# Patient Record
Sex: Male | Born: 1950 | Race: White | Hispanic: No | Marital: Married | Smoking: Never smoker
Health system: Southern US, Community
[De-identification: ages and names within clinical notes are randomized; demographics above are authoritative.]

## PROBLEM LIST (undated history)

## (undated) DIAGNOSIS — S060X9A Concussion with loss of consciousness of unspecified duration, initial encounter: Secondary | ICD-10-CM

## (undated) DIAGNOSIS — I4891 Unspecified atrial fibrillation: Secondary | ICD-10-CM

## (undated) DIAGNOSIS — M109 Gout, unspecified: Secondary | ICD-10-CM

## (undated) DIAGNOSIS — N433 Hydrocele, unspecified: Secondary | ICD-10-CM

## (undated) DIAGNOSIS — I10 Essential (primary) hypertension: Secondary | ICD-10-CM

## (undated) DIAGNOSIS — S060XAA Concussion with loss of consciousness status unknown, initial encounter: Secondary | ICD-10-CM

## (undated) DIAGNOSIS — C801 Malignant (primary) neoplasm, unspecified: Secondary | ICD-10-CM

## (undated) DIAGNOSIS — J189 Pneumonia, unspecified organism: Secondary | ICD-10-CM

## (undated) HISTORY — PX: COLONOSCOPY: SHX174

## (undated) HISTORY — DX: Essential (primary) hypertension: I10

## (undated) HISTORY — PX: TONSILLECTOMY: SUR1361

## (undated) HISTORY — DX: Gout, unspecified: M10.9

---

## 1983-08-02 HISTORY — PX: INGUINAL HERNIA REPAIR: SUR1180

## 2012-03-29 ENCOUNTER — Other Ambulatory Visit: Payer: Self-pay | Admitting: Internal Medicine

## 2012-03-29 DIAGNOSIS — R319 Hematuria, unspecified: Secondary | ICD-10-CM

## 2012-04-03 ENCOUNTER — Other Ambulatory Visit: Payer: Self-pay

## 2012-04-06 ENCOUNTER — Ambulatory Visit
Admission: RE | Admit: 2012-04-06 | Discharge: 2012-04-06 | Disposition: A | Discharge status: Home / Self Care | Payer: PRIVATE HEALTH INSURANCE | Source: Ambulatory Visit | Attending: Internal Medicine | Admitting: Internal Medicine

## 2012-04-06 DIAGNOSIS — R319 Hematuria, unspecified: Secondary | ICD-10-CM

## 2012-05-30 ENCOUNTER — Encounter (INDEPENDENT_AMBULATORY_CARE_PROVIDER_SITE_OTHER): Payer: Self-pay | Admitting: General Surgery

## 2012-05-31 ENCOUNTER — Encounter (INDEPENDENT_AMBULATORY_CARE_PROVIDER_SITE_OTHER): Payer: Self-pay | Admitting: General Surgery

## 2012-05-31 ENCOUNTER — Ambulatory Visit (INDEPENDENT_AMBULATORY_CARE_PROVIDER_SITE_OTHER): Payer: PRIVATE HEALTH INSURANCE | Admitting: General Surgery

## 2012-05-31 VITALS — BP 120/80 | HR 76 | Temp 98.5°F | Resp 16 | Ht 71.0 in | Wt 167.0 lb

## 2012-05-31 DIAGNOSIS — K3189 Other diseases of stomach and duodenum: Secondary | ICD-10-CM

## 2012-05-31 DIAGNOSIS — K319 Disease of stomach and duodenum, unspecified: Secondary | ICD-10-CM

## 2012-05-31 NOTE — Patient Instructions (Signed)
We will refer you to your gastroenterologist for upper endoscopy (EGD) +/- EUS (endoscopic ultrasound) to evaluate mass

## 2012-05-31 NOTE — Progress Notes (Signed)
Patient ID: [name], male   DOB: [birth year], 61 y.o.   MRN: [id number]  Chief Complaint  Patient presents with  . Mass    abdomen    HPI [name] is a 61 y.o. male.   HPI 61 year old Caucasian male referred by Dr. Annabell Howells for evaluation of a lobular mass seen on the CT scan of his abdomen and pelvis. The patient was getting a CT scan to evaluate for blood in his urine. He states that after his physical by his primary care physician a few months ago he developed some right inguinal discomfort. It was intermittent in nature. It was more of a pressure sensation. Sitting upright for prolonged period of time would aggravate it. He was put on a trial of antibiotics with some improvement. However once antibiotics ran out he started to have recurrent symptoms. He was ultimately referred to urology. They diagnosed a right spermatocele as well as microscopic blood in his urine. On his CT scan of his abdomen and pelvis there was found to be a lobular mass seen next is third portion of the duodenum and inferior vena cava. He is referred here for evaluation for that.  He denies any abdominal pain otherwise. He denies any bloating, early satiety, reflux, weight loss, jaundice, diarrhea, constipation, melena, or hematochezia. He states he had a colonoscopy a few years ago which was normal. He states that his grandfather died from some form of cancer although he cannot recall what type. He reports a normal energy level. He also reports a normal appetite. He denies any palpitations Past Medical History  Diagnosis Date  . Hypertension   . Gout     Past Surgical History  Procedure Date  . Inguinal hernia repair 1985    right    Family History  Problem Relation Age of Onset  . Heart disease Father     Social History History  Substance Use Topics  . Smoking status: Never Smoker   . Smokeless tobacco: Not on file  . Alcohol Use: Yes     2-3 per day    Allergies  Allergen Reactions  .  Sulfa Antibiotics Itching    Current Outpatient Prescriptions  Medication Sig Dispense Refill  . AMLODIPINE BESYLATE PO Take by mouth daily.      . naproxen sodium (ANAPROX) 220 MG tablet Take 220 mg by mouth as needed.        Review of Systems Review of Systems  Constitutional: Negative for fever, chills, appetite change and unexpected weight change.  HENT: Negative for congestion and trouble swallowing.   Eyes: Negative for photophobia, redness and visual disturbance.  Respiratory: Negative for chest tightness and shortness of breath.   Cardiovascular: Negative for chest pain and leg swelling.       No PND, no orthopnea, no DOE  Gastrointestinal:       See HPI  Genitourinary: Positive for testicular pain. Negative for dysuria, hematuria and difficulty urinating.  Musculoskeletal: Negative.   Skin: Negative for rash.  Neurological: Negative for dizziness, seizures, facial asymmetry and speech difficulty.  Hematological: Negative for adenopathy. Does not bruise/bleed easily.  Psychiatric/Behavioral: Negative for behavioral problems and confusion.    Blood pressure 120/80, pulse 76, temperature 98.5 F (36.9 C), temperature source Temporal, resp. rate 16, height 5\' 11"  (1.803 m), weight 167 lb (75.751 kg).  Physical Exam Physical Exam  Vitals reviewed. Constitutional: He is oriented to person, place, and time. He appears well-developed and well-nourished. No distress.  HENT:  Head: Normocephalic and atraumatic.  Right Ear: External ear normal.  Left Ear: External ear normal.  Eyes: Conjunctivae normal are normal. No scleral icterus.  Neck: Normal range of motion. Neck supple. No tracheal deviation present. No thyromegaly present.  Cardiovascular: Normal rate, regular rhythm and normal heart sounds.   Pulmonary/Chest: Effort normal and breath sounds normal. No respiratory distress. He has no wheezes.  Abdominal: Soft. Bowel sounds are normal. He exhibits no distension. There  is no tenderness. There is no rebound and no guarding. Hernia confirmed negative in the right inguinal area and confirmed negative in the left inguinal area.  Genitourinary: Penis normal.       Right spermatocele. Some TTP on epidid.  Musculoskeletal: Normal range of motion. He exhibits no edema and no tenderness.  Lymphadenopathy:    He has no cervical adenopathy.       Right: No inguinal adenopathy present.       Left: No inguinal adenopathy present.  Neurological: He is alert and oriented to person, place, and time. He exhibits normal muscle tone.  Skin: Skin is warm and dry. No rash noted. He is not diaphoretic. No erythema. No pallor.  Psychiatric: He has a normal mood and affect. His behavior is normal. Judgment and thought content normal.    Data Reviewed Dr Belva Crome note  Ct 05/27/12 - reviewed report and imaging  CT abdomen and pelvis with and without contrast Renal: Non-IV contrast images demonstrate no nephrolithiasis or ureterolithiasis. Cortical phase imaging demonstrates a nonenhancing 16mm cyst extending from the mid right kidney. Pyelogram phase imaging demonstrates no filling defects in the collecting systems or ureters. Lung bases are clear. No pericardial fluid. No focal hepatic lesion. The gallbladder, pancreas, spleen, adrenal glands, and adrenal glands are normal. The stomach, small bowel, appendix, and cecum are normal. The rectosigmoid colon is normal. Abdominal aorta normal caliber. There is a lobular mass lesion anterior to the inferior vena cava and aorta just above the bifurcation measuring 3.3 x 2.9 cm. This enhances from the noncontrast to postcontrast sequences. No evidence of calcification or desmoplastic reaction. No clear fat plain between the lesion and the third portion of the duodenum as it crosses the midline. No peritoneal disease. No additional evidence of adenopathy in the abdomen or pelvis.  No explanation for hematuria Enhancing lobular mass inferior to  the third portion of the duodenum. Differential would include a duodenal diverticulum, enlarged lymph node, or neoplasm such as a carcinoid tumor.  Assessment    Retroperitoneal abdominal mass     Plan    In reviewing the CT imaging myself, it appears to me that this is connected to the duodenum. Differential includes duodenal diverticulum versus enlarged lymph node versus neoplasm. My suspicion for malignancy is low. He has no associated symptoms. I believe the right groin pain is not related to this lesion. There is no evidence of a recurrent inguinal hernia. I reviewed the CT images with the patient and his wife. We discussed several workup options for the retroperitoneal mass. We discussed additional imaging versus endoscopic imaging. My recommendation was to refer him back to his gastroenterologist for upper endoscopy with possible endoscopic ultrasound. If there is no clear defined diagnosis on upper endoscopy this appears that it would be amenable to a transduodenal biopsy by endoscopic ultrasound. I will bas his followup on the results of his gastroenterology consultation. I recommend a trial of motrin for his right groin pain.  [name]. Andrey Campanile, MD, FACS General, Bariatric, & Minimally Invasive  Surgery [facility], [place]        [facility] M 05/31/2012, 2:43 PM

## 2012-06-27 ENCOUNTER — Ambulatory Visit (INDEPENDENT_AMBULATORY_CARE_PROVIDER_SITE_OTHER): Payer: PRIVATE HEALTH INSURANCE | Admitting: Surgery

## 2012-08-01 DIAGNOSIS — C801 Malignant (primary) neoplasm, unspecified: Secondary | ICD-10-CM

## 2012-08-01 HISTORY — DX: Malignant (primary) neoplasm, unspecified: C80.1

## 2012-08-23 ENCOUNTER — Encounter (INDEPENDENT_AMBULATORY_CARE_PROVIDER_SITE_OTHER): Payer: Self-pay

## 2012-10-16 ENCOUNTER — Encounter (HOSPITAL_COMMUNITY): Payer: Self-pay | Admitting: Pharmacy Technician

## 2012-10-16 ENCOUNTER — Encounter (HOSPITAL_COMMUNITY): Payer: Self-pay | Admitting: *Deleted

## 2012-10-24 ENCOUNTER — Ambulatory Visit (HOSPITAL_COMMUNITY)
Admission: RE | Admit: 2012-10-24 | Discharge: 2012-10-24 | Disposition: A | Discharge status: Home / Self Care | Payer: PRIVATE HEALTH INSURANCE | Source: Ambulatory Visit | Attending: Gastroenterology | Admitting: Gastroenterology

## 2012-10-24 ENCOUNTER — Encounter (HOSPITAL_COMMUNITY): Payer: Self-pay | Admitting: Anesthesiology

## 2012-10-24 ENCOUNTER — Encounter (HOSPITAL_COMMUNITY): Payer: Self-pay

## 2012-10-24 ENCOUNTER — Ambulatory Visit (HOSPITAL_COMMUNITY): Payer: PRIVATE HEALTH INSURANCE | Admitting: Anesthesiology

## 2012-10-24 ENCOUNTER — Encounter (HOSPITAL_COMMUNITY)
Admission: RE | Disposition: A | Discharge status: Home / Self Care | Payer: Self-pay | Source: Ambulatory Visit | Attending: Gastroenterology

## 2012-10-24 DIAGNOSIS — R933 Abnormal findings on diagnostic imaging of other parts of digestive tract: Secondary | ICD-10-CM | POA: Insufficient documentation

## 2012-10-24 DIAGNOSIS — Q760 Spina bifida occulta: Secondary | ICD-10-CM | POA: Insufficient documentation

## 2012-10-24 DIAGNOSIS — I10 Essential (primary) hypertension: Secondary | ICD-10-CM | POA: Insufficient documentation

## 2012-10-24 DIAGNOSIS — R6889 Other general symptoms and signs: Secondary | ICD-10-CM | POA: Insufficient documentation

## 2012-10-24 DIAGNOSIS — G4700 Insomnia, unspecified: Secondary | ICD-10-CM | POA: Insufficient documentation

## 2012-10-24 DIAGNOSIS — Z882 Allergy status to sulfonamides status: Secondary | ICD-10-CM | POA: Insufficient documentation

## 2012-10-24 DIAGNOSIS — H9190 Unspecified hearing loss, unspecified ear: Secondary | ICD-10-CM | POA: Insufficient documentation

## 2012-10-24 DIAGNOSIS — Z79899 Other long term (current) drug therapy: Secondary | ICD-10-CM | POA: Insufficient documentation

## 2012-10-24 DIAGNOSIS — J309 Allergic rhinitis, unspecified: Secondary | ICD-10-CM | POA: Insufficient documentation

## 2012-10-24 HISTORY — PX: EUS: SHX5427

## 2012-10-24 HISTORY — PX: ESOPHAGOGASTRODUODENOSCOPY: SHX5428

## 2012-10-24 HISTORY — PX: FINE NEEDLE ASPIRATION: SHX5430

## 2012-10-24 SURGERY — EGD (ESOPHAGOGASTRODUODENOSCOPY)
Anesthesia: Monitor Anesthesia Care

## 2012-10-24 MED ORDER — BUTAMBEN-TETRACAINE-BENZOCAINE 2-2-14 % EX AERO
INHALATION_SPRAY | CUTANEOUS | Status: DC | PRN
  Administered 2012-10-24: 2 via TOPICAL

## 2012-10-24 MED ORDER — SODIUM CHLORIDE 0.9 % IV SOLN: INTRAVENOUS | Status: DC

## 2012-10-24 MED ORDER — MIDAZOLAM HCL 5 MG/5ML IJ SOLN
INTRAMUSCULAR | Status: DC | PRN
  Administered 2012-10-24: 2 mg via INTRAVENOUS

## 2012-10-24 MED ORDER — LACTATED RINGERS IV SOLN
INTRAVENOUS | Status: DC
  Administered 2012-10-24: 1000 mL via INTRAVENOUS

## 2012-10-24 MED ORDER — LACTATED RINGERS IV SOLN
INTRAVENOUS | Status: DC
  Administered 2012-10-24: 07:00:00 via INTRAVENOUS

## 2012-10-24 MED ORDER — FENTANYL CITRATE 0.05 MG/ML IJ SOLN
INTRAMUSCULAR | Status: DC | PRN
  Administered 2012-10-24 (×2): 25 ug via INTRAVENOUS

## 2012-10-24 MED ORDER — PROPOFOL 10 MG/ML IV EMUL
INTRAVENOUS | Status: DC | PRN
  Administered 2012-10-24: 75 ug/kg/min via INTRAVENOUS

## 2012-10-24 MED ORDER — FENTANYL CITRATE 0.05 MG/ML IJ SOLN: 25.0000 ug | INTRAMUSCULAR | Status: DC | PRN

## 2012-10-24 NOTE — Anesthesia Postprocedure Evaluation (Signed)
  Anesthesia Post-op Note  Patient: [name]  Procedure(s) Performed: Procedure(s) (LRB): ESOPHAGOGASTRODUODENOSCOPY (EGD) (N/A) ESOPHAGEAL ENDOSCOPIC ULTRASOUND (EUS) RADIAL (N/A) FINE NEEDLE ASPIRATION (FNA) LINEAR (N/A)  Patient Location: PACU  Anesthesia Type: MAC  Level of Consciousness: awake and alert   Airway and Oxygen Therapy: Patient Spontanous Breathing  Post-op Pain: mild  Post-op Assessment: Post-op Vital signs reviewed, Patient's Cardiovascular Status Stable, Respiratory Function Stable, Patent Airway and No signs of Nausea or vomiting  Last Vitals:  Filed Vitals:   10/24/12 1008  BP: 146/94  Temp: 36.4 C  Resp: 10    Post-op Vital Signs: stable   Complications: No apparent anesthesia complications

## 2012-10-24 NOTE — Op Note (Signed)
[facility] [address] [place], [address]   OPERATIVE PROCEDURE REPORT  PATIENT: [name], [name].  MR#: [id number] BIRTHDATE: [birth year]  GENDER: Male ENDOSCOPIST: Willis Modena, MD REFERRED BY:  R.  Robley Fries, M.D. PROCEDURE DATE:  10/24/2012 PROCEDURE:   Upper Endoscopic Ultrasound with FNA    ASA CLASS: Class II  INDICATIONS:1.  periaortic lesion seen on CT scan. MEDICATIONS: Cetacaine spray x 2 and MAC sedation, administered by CRNA DESCRIPTION OF PROCEDURE:   After the risks benefits and alternatives of the procedure were  explained, informed consent was obtained. The patient was then placed in the left, lateral, decubitus postion and IV sedation was administered. Throught the procedure, the patients blood pressure, pulse and oxygen saturations were monitored continuously.  Under direct visualization, the     endoscope was introduced through the mouth and advanced to the second portion of the duodenum .  Water was used as necessary to provide an acoustic interface.  Imaging was obtained at 7.5 and . Upon completion of the imaging, water was removed and the patient was sent to the recovery room in satisfactory condition.     FINDINGS:  33 x 34 hypoechoic but lobulated lesion seen on EUS, causing extrinsic compression onto the c-loop of the duodenum. Endoscopically there was no mucosal abnormality of the duodenum. The lesion was biopsied x 4 (22g, 3 for slides, 1 for cellblock). This lesion does not appear to involve the pancreas or ampulla. The lesion does not appear to readily arise from the wall of the duodenum.  The scope was then withdrawn from the patient and the procedure terminated.  COMPLICATIONS: None  ENDOSCOPIC IMPRESSION: As above.  RECOMMENDATIONS: 1.  Watch for potential complications of procedure. 2.  Await cytology results. 3.  Next step in management pending biopsy  results.     _______________________________ Rosalie DoctorWillis Modena, MD 10/24/2012 10:20 AM CC:

## 2012-10-24 NOTE — Transfer of Care (Signed)
Immediate Anesthesia Transfer of Care Note  Patient: [name]  Procedure(s) Performed: Procedure(s) (LRB): ESOPHAGOGASTRODUODENOSCOPY (EGD) (N/A) ESOPHAGEAL ENDOSCOPIC ULTRASOUND (EUS) RADIAL (N/A) FINE NEEDLE ASPIRATION (FNA) LINEAR (N/A)  Patient Location: PACU  Anesthesia Type: MAC  Level of Consciousness: sedated, patient cooperative and responds to stimulaton  Airway & Oxygen Therapy: Patient Spontanous Breathing and Patient connected to face mask oxgen  Post-op Assessment: Report given to PACU RN and Post -op Vital signs reviewed and stable  Post vital signs: Reviewed and stable  Complications: No apparent anesthesia complications

## 2012-10-24 NOTE — Anesthesia Preprocedure Evaluation (Addendum)
Anesthesia Evaluation  Patient identified by MRN, date of birth, ID band Patient awake    Reviewed: Allergy & Precautions, H&P , NPO status , Patient's Chart, lab work & pertinent test results  Airway Mallampati: II TM Distance: >3 FB Neck ROM: full    Dental no notable dental hx. (+) Teeth Intact and Dental Advisory Given   Pulmonary neg pulmonary ROS,  breath sounds clear to auscultation  Pulmonary exam normal       Cardiovascular hypertension, Pt. on medications Rhythm:regular Rate:Normal     Neuro/Psych negative neurological ROS  negative psych ROS   GI/Hepatic negative GI ROS, Neg liver ROS,   Endo/Other  negative endocrine ROS  Renal/GU negative Renal ROS  negative genitourinary   Musculoskeletal   Abdominal   Peds  Hematology negative hematology ROS (+)   Anesthesia Other Findings   Reproductive/Obstetrics negative OB ROS                         Anesthesia Physical Anesthesia Plan  ASA: II  Anesthesia Plan: MAC   Post-op Pain Management:    Induction:   Airway Management Planned: Simple Face Mask  Additional Equipment:   Intra-op Plan:   Post-operative Plan:   Informed Consent: I have reviewed the patients History and Physical, chart, labs and discussed the procedure including the risks, benefits and alternatives for the proposed anesthesia with the patient or authorized representative who has indicated his/her understanding and acceptance.   Dental Advisory Given  Plan Discussed with: CRNA and Surgeon  Anesthesia Plan Comments:         Anesthesia Quick Evaluation

## 2012-10-24 NOTE — H&P (Signed)
Patient interval history reviewed.  Patient examined again.  There has been no change from documented H/P dated 10/23/12 (scanned into chart from our office) except as documented above.  Assessment:  1.  Periduodenal, periaortic lesion seen on CT.  No GI symptoms.  Found incidentally after evaluation for hematuria.    Plan:  1.  Endoscopy with possible endoscopic ultrasound and biopsies (fine needle aspiration, FNA).  If I can not visualize lesion in question on EUS, next step management will likely be repeat imaging (CT scan) with possible CT-guided biopsy. 2.  Risks (bleeding, infection, bowel perforation that could require surgery, sedation-related changes in cardiopulmonary systems), benefits (identification and possible treatment of source of symptoms, exclusion of certain causes of symptoms), and alternatives (watchful waiting, radiographic imaging studies, empiric medical treatment) of upper endoscopy with ultrasound and possible biopsies (EGD + EUS +/- FNA) were explained to patient/family in detail and patient wishes to proceed.

## 2012-10-25 ENCOUNTER — Encounter (HOSPITAL_COMMUNITY): Payer: Self-pay | Admitting: Gastroenterology

## 2012-11-01 ENCOUNTER — Telehealth (INDEPENDENT_AMBULATORY_CARE_PROVIDER_SITE_OTHER): Payer: Self-pay

## 2012-11-01 NOTE — Telephone Encounter (Signed)
Pt states Dr Dulce Sellar did EGD with BX and he advised pt we would be calling pt to discuss surgical removal of area. I advised pt msg will be sent to Dr Andrey Campanile and his assistant to review test and cytology in system. Pt will be awaiting a call back.Pt can be reached [phone].

## 2012-11-05 NOTE — Telephone Encounter (Signed)
Per Dr Andrey Campanile patient needs an appt with him to discuss surgery. Appt made for 11/19/2012. Per Dr Andrey Campanile patient needs added to GI tumor conference. Email sent to add patient to conference list.

## 2012-11-14 ENCOUNTER — Telehealth (INDEPENDENT_AMBULATORY_CARE_PROVIDER_SITE_OTHER): Payer: Self-pay | Admitting: General Surgery

## 2012-11-14 ENCOUNTER — Telehealth (INDEPENDENT_AMBULATORY_CARE_PROVIDER_SITE_OTHER): Payer: Self-pay | Admitting: *Deleted

## 2012-11-14 ENCOUNTER — Other Ambulatory Visit (INDEPENDENT_AMBULATORY_CARE_PROVIDER_SITE_OTHER): Payer: Self-pay | Admitting: General Surgery

## 2012-11-14 DIAGNOSIS — D3A8 Other benign neuroendocrine tumors: Secondary | ICD-10-CM

## 2012-11-14 NOTE — Telephone Encounter (Signed)
Left message on machine for patient to call back and ask for me. To make him aware we need to do a repeat CT prior to his follow up appt and see if any dates/times are better. Awaiting call back.

## 2012-11-14 NOTE — Telephone Encounter (Signed)
Patient made aware we are going to set this up and call him back with an appt.

## 2012-11-14 NOTE — Telephone Encounter (Signed)
Message copied by Liliana Cline on Wed Nov 14, 2012 10:43 AM ------      Message from: Andrey Campanile, ERIC M      Created: Wed Nov 14, 2012 10:01 AM       [facility] your week is going well. This pt will need a CT abd-pelvis with oral and IV contrast - liver protocol before his f/u appt with me. His last CT was in oct 2013. Need to see if lesion has changed.             Thanks      wilson ------

## 2012-11-14 NOTE — Telephone Encounter (Signed)
Lurena Joiner called with [facility] to ask about CT order.  Spoke with Andrey Campanile MD who stated to change order to CT Abd/Pelvis with and without contrast which is liver protocol.

## 2012-11-14 NOTE — Telephone Encounter (Signed)
Patients wife is aware of appt  Day and time 11/15/12 1:45  Patient needs to pick up contrast today

## 2012-11-15 ENCOUNTER — Ambulatory Visit
Admission: RE | Admit: 2012-11-15 | Discharge: 2012-11-15 | Disposition: A | Discharge status: Home / Self Care | Payer: PRIVATE HEALTH INSURANCE | Source: Ambulatory Visit | Attending: General Surgery | Admitting: General Surgery

## 2012-11-15 DIAGNOSIS — D3A8 Other benign neuroendocrine tumors: Secondary | ICD-10-CM

## 2012-11-15 MED ORDER — IOHEXOL 300 MG/ML  SOLN
100.0000 mL | Freq: Once | INTRAMUSCULAR | Status: AC | PRN
  Administered 2012-11-15: 100 mL via INTRAVENOUS

## 2012-11-19 ENCOUNTER — Encounter (INDEPENDENT_AMBULATORY_CARE_PROVIDER_SITE_OTHER): Payer: Self-pay | Admitting: General Surgery

## 2012-11-19 ENCOUNTER — Ambulatory Visit (INDEPENDENT_AMBULATORY_CARE_PROVIDER_SITE_OTHER): Payer: PRIVATE HEALTH INSURANCE | Admitting: General Surgery

## 2012-11-19 VITALS — BP 122/80 | HR 76 | Resp 16 | Ht 71.0 in | Wt 167.0 lb

## 2012-11-19 DIAGNOSIS — D3A8 Other benign neuroendocrine tumors: Secondary | ICD-10-CM | POA: Insufficient documentation

## 2012-11-19 DIAGNOSIS — D3A00 Benign carcinoid tumor of unspecified site: Secondary | ICD-10-CM

## 2012-11-19 DIAGNOSIS — R1900 Intra-abdominal and pelvic swelling, mass and lump, unspecified site: Secondary | ICD-10-CM

## 2012-11-19 NOTE — Progress Notes (Signed)
Patient ID: [name], male   DOB: [birth year], 62 y.o.   MRN: [id number]  Chief Complaint  Patient presents with  . Tumor    neuroendocrine    HPI [name] is a 62 y.o. male.   HPI 62 year old Caucasian male comes in to discuss the results of his endoscopic ultrasound and biopsy of a retroperitoneal mass. I initially saw the patient in October 2013 after an incidental mass was found around his aorta and vena cava on a CT scan that was ordered to evaluate for microscopic hematuria. It was near his third portion of his duodenum. At that time we recommended upper endoscopy with EUS and biopsy. He saw the gastroenterologist but declined evaluation at that time. He subsequently changed his mind and underwent endoscopic ultrasound on March 26 which showed a 33 x 34 mm hypoechoic lobulated lesion causing extrinsic compression onto the C-loop of the duodenum. It was biopsied which showed a well-differentiated neuroendocrine tumor grade 1. He is here to discuss biopsy results and plan  Since he was last seen he denies any medical changes. He denies any abdominal pain, nausea, vomiting, diarrhea or constipation. He denies any flushing. He denies any weight change. He denies any fevers or chills. He denies any palpitations. He has had a colonoscopy a few years ago which she reports was normal.  Past Medical History  Diagnosis Date  . Hypertension   . Gout     Past Surgical History  Procedure Laterality Date  . Inguinal hernia repair  1985    right  . Esophagogastroduodenoscopy N/A 10/24/2012    Procedure: ESOPHAGOGASTRODUODENOSCOPY (EGD);  Surgeon: Willis Modena, MD;  Location: [facility];  Service: Endoscopy;  Laterality: N/A;  . Eus N/A 10/24/2012    Procedure: ESOPHAGEAL ENDOSCOPIC ULTRASOUND (EUS) RADIAL;  Surgeon: Willis Modena, MD;  Location: [facility];  Service: Endoscopy;  Laterality: N/A;  + or - FNA  . Fine needle aspiration N/A 10/24/2012    Procedure: FINE NEEDLE  ASPIRATION (FNA) LINEAR;  Surgeon: Willis Modena, MD;  Location: [facility];  Service: Endoscopy;  Laterality: N/A;    Family History  Problem Relation Age of Onset  . Heart disease Father     Social History History  Substance Use Topics  . Smoking status: Never Smoker   . Smokeless tobacco: Never Used  . Alcohol Use: Yes     Comment: 2-3 per day    Allergies  Allergen Reactions  . Sulfa Antibiotics Itching    Current Outpatient Prescriptions  Medication Sig Dispense Refill  . aspirin 325 MG EC tablet Take 650 mg by mouth every 4 (four) hours as needed for pain.      . valsartan-hydrochlorothiazide (DIOVAN-HCT) 160-12.5 MG per tablet Take 1 tablet by mouth every morning.       No current facility-administered medications for this visit.    Review of Systems Review of Systems  Constitutional: Negative for fever, chills, appetite change and unexpected weight change.  HENT: Negative for congestion and trouble swallowing.   Eyes: Negative for visual disturbance.  Respiratory: Negative for chest tightness and shortness of breath.   Cardiovascular: Negative for chest pain and leg swelling.       No PND, no orthopnea, no DOE  Gastrointestinal: Negative for nausea, vomiting, abdominal pain, diarrhea, constipation and blood in stool.       See HPI  Genitourinary: Negative for dysuria, hematuria, flank pain and difficulty urinating.  Musculoskeletal: Negative.   Skin: Negative for rash.  Neurological: Negative  for seizures and speech difficulty.       Denies amaurosis fugax, TIA  Hematological: Does not bruise/bleed easily.  Psychiatric/Behavioral: Negative for behavioral problems and confusion.    Blood pressure 122/80, pulse 76, resp. rate 16, height 5\' 11"  (1.803 m), weight 167 lb (75.751 kg).  Physical Exam Physical Exam  Vitals reviewed. Constitutional: He is oriented to person, place, and time. He appears well-developed and well-nourished. No distress.  HENT:   Head: Normocephalic and atraumatic.  Right Ear: External ear normal.  Left Ear: External ear normal.  Eyes: Conjunctivae are normal. No scleral icterus.  Neck: Normal range of motion. Neck supple. No tracheal deviation present. No thyromegaly present.  Cardiovascular: Normal rate, regular rhythm and normal heart sounds.   Pulmonary/Chest: Effort normal and breath sounds normal. No respiratory distress. He has no wheezes.  Abdominal: Soft. Bowel sounds are normal. He exhibits no distension. There is no tenderness. There is no rebound and no guarding.  Musculoskeletal: He exhibits no edema and no tenderness.  Lymphadenopathy:    He has no cervical adenopathy.  Neurological: He is alert and oriented to person, place, and time.  Skin: Skin is warm and dry. No rash noted. He is not diaphoretic. No erythema.  Psychiatric: He has a normal mood and affect. His behavior is normal. Judgment and thought content normal.    Data Reviewed My office note Path report - neuroendocrine tumor, well differentiated, Grade 1  EUS report 33 x34 hypoechoic but lobulated seen on EUS, extrinsic compression onto C loop of duodenum. No mucosal abnormality of the duodenum. Does not appear to involve pancreas or ampulla. Does not appear to arise from wall of duodenum.   CT a/p 10/2012 CT ABDOMEN AND PELVIS WITHOUT AND WITH CONTRAST  Technique: Multidetector CT imaging of the abdomen and pelvis was  performed without contrast material in one or both body regions,  followed by contrast material(s) and further sections in one or  both body regions.  Contrast: OMNIPAQUE IOHEXOL 300 MG/ML SOLN  Comparison: 05/25/2012  Findings: Emphysematous changes at the lung bases.  Liver is within normal limits. No suspicious/enhancing hepatic  lesions.  Spleen, pancreas, and adrenal glands are within normal limits.  Gallbladder is unremarkable. No intrahepatic or extrahepatic  ductal dilatation.  Kidneys are within  normal limits. No hydronephrosis.  No evidence of bowel obstruction. Normal appendix.  Atherosclerotic calcifications of the abdominal aorta and branch  vessels.  No abdominopelvic ascites.  3.0 x 4.3 x 5.2 cm aortocaval soft tissue mass (series 3/image  203), corresponding to known neuroendocrine tumor, previously 3.0 x  4.1 x 5.2 cm when measured in a similar fashion, unchanged.  No suspicious abdominopelvic lymphadenopathy.  Prostate is grossly unremarkable.  Bladder is within normal limits.  Grade II spondylolisthesis at L5-S1.  IMPRESSION:  3.0 x 4.3 x 5.2 cm aortocaval soft tissue mass, corresponding to  known neuroendocrine tumor, grossly unchanged.  No evidence of metastatic disease in the abdomen/pelvis.   Assessment    Retroperitoneal neuroendocrine tumor of unknown primary    Plan    We reviewed his imaging and biopsy results today. It appears he has a neuroendocrine tumor of unknown primary. I explained that this is a rare tumor which is a form of cancer. He was given patient information.. I explained that it can act as a benign lesion all the way up to a tumor with a very aggressive malignant behavior. His biopsy results favor a low-grade neuroendocrine tumor. His repeat CT scan done  last week shows no change in the size of the lesion. There appears to be no other abdominal pathology. The pancreas, adrenals, small intestine, and appendix all appear radiographically normal. No symptoms of carcinoid syndrome.   It is not appear to arise from his duodenum. I explained that the endoscopic ultrasound was the best test we have preoperatively to determine any involvement of the duodenum.  We discussed the pros and cons of octreotide scan. I have recommended an octreotide scan. However I still would like to go ahead and plan for exploratory laparotomy. If however the octreotide scan is  positive, this may change operative plans.   We will also check a chromogranin A level  The  patient would like to proceed with surgery. We'll plan to do an exploratory laparotomy with resection of the retroperitoneal periaortic-caval mass. We discussed the risk and benefits of surgery including but not limited to bleeding, infection, injury to surrounding structures, incisional hernia formation, wound infection, ileus, anesthesia risk, blood clot formation. We also talked about the possibility that intraoperatively we may discover that the lesion involves his small bowel which may require a segmental bowel resection with possible Whipple procedure. I explained that this would be very unusual but I wanted to prepare him mentally for the possibility of a pancreaticoduodenectomy. We discussed anastomotic stricture, anastomotic leak, pancreatic leak bile leak, drain placement, potential feeding tube placement and prolonged hospitalization. We also talked about the fact that the mass is near several large important blood vessels namely being the aorta and vena cava. I explained that there was potential for blood loss requiring blood transfusion and possibly vascular surgery intervention.  I explained that this would be done through an open upper midline incision. I explained that my partner Dr. Donell Beers would be assisting me with the procedure. If it turns out to involve duodenal resection with possible Whipple I explained to Dr. Donell Beers would become the primary surgeon.  I have arranged an appointment for the patient and his wife to meet with Dr. Donell Beers tomorrow afternoon to discuss possible Whipple surgery.  Hopefully, as it appears on radiographic imaging, there be a nice plane of dissection around the mass which will not require a more invasive procedure.  We discussed the typical aftercare and the typical postoperative recovery course for a straightforward laparotomy as well as briefly touched upon the postoperative recovery course for a Whipple procedure  A total of 50 minutes was spent for this  office visit  [name]. Andrey Campanile, MD, FACS General, Bariatric, & Minimally Invasive Surgery [facility], [place]        [facility] M 11/19/2012, 2:58 PM

## 2012-11-19 NOTE — Patient Instructions (Signed)
Octreotide Scan This is a scan which is used to find tumors that are made up of nerve and gland tissue. In this test, a radioactive material is injected into the blood stream. The material injected is a very small amount which is not harmful to the patient. A scan is then done using a specialized type of camera which is similar to taking an x-ray. This will help detect carcinoid tumors, gastrinomas, insulinomas, glucagonoma, and other abnormalities and cancers. PREPARATION FOR TEST No preparation or fasting is necessary. NORMAL FINDINGS No evidence of increased uptake throughout the body. Ranges for normal findings may vary among different laboratories and hospitals. You should always check with your doctor after having lab work or other tests done to discuss the meaning of your test results and whether your values are considered within normal limits. MEANING OF TEST  Your caregiver will go over the test results with you and discuss the importance and meaning of your results, as well as treatment options and the need for additional tests if necessary. OBTAINING THE TEST RESULTS It is your responsibility to obtain your test results. Ask the lab or department performing the test when and how you will get your results. Document Released: 10/06/2004 Document Revised: 10/10/2011 Document Reviewed: 06/28/2008 [facility] Patient Information 2013 [place], [place].

## 2012-11-20 ENCOUNTER — Telehealth (INDEPENDENT_AMBULATORY_CARE_PROVIDER_SITE_OTHER): Payer: Self-pay | Admitting: General Surgery

## 2012-11-20 ENCOUNTER — Encounter (INDEPENDENT_AMBULATORY_CARE_PROVIDER_SITE_OTHER): Payer: Self-pay | Admitting: General Surgery

## 2012-11-20 ENCOUNTER — Ambulatory Visit (INDEPENDENT_AMBULATORY_CARE_PROVIDER_SITE_OTHER): Payer: PRIVATE HEALTH INSURANCE | Admitting: General Surgery

## 2012-11-20 VITALS — BP 136/90 | HR 87 | Temp 98.5°F | Ht 71.0 in | Wt 167.0 lb

## 2012-11-20 DIAGNOSIS — D3A8 Other benign neuroendocrine tumors: Secondary | ICD-10-CM

## 2012-11-20 DIAGNOSIS — D3A00 Benign carcinoid tumor of unspecified site: Secondary | ICD-10-CM

## 2012-11-20 NOTE — Assessment & Plan Note (Signed)
Pt does not want to have whipple, even at the risk of leaving the mass in place.  I advised him that I would respect his wishes in that regard.    We will plan dx laparoscopy, evaluation of pancreas and bowel with ultrasound and/or direct inspection.  If we find additional area of neuroendocrine tumor in small bowel, would resect that.

## 2012-11-20 NOTE — Telephone Encounter (Signed)
Spoke with patient he is aware of test at [facility] on 12/05/12 1245 - possible 12/07/12

## 2012-11-20 NOTE — Patient Instructions (Signed)
Will plan to remove mass, look/ultrasound bowel/pancreas.   If we determine that mass cannot come out without whipple, would abort procedure.  If we find another mass in bowel, would remove that at same time.

## 2012-11-20 NOTE — Progress Notes (Signed)
HISTORY: Patient is a 62 year old male with neuroendocrine tumor in the retroperitoneum. Patient originally did not want to pursue treatment or evaluation. He subsequently followed up and had endoscopic ultrasound with biopsy. This is consistent with neuroendocrine tumor. This is causing compression of the C-loop of the duodenum.  There is no obvious primary unless this is coming off the duodenum.  He saw Dr. Andrey Campanile yesterday and discussed surgery.  He is quite anxious about having the mass removed.      PATHOLOGY: Diagnosis FINE NEEDLE ASPIRATION, ENDOSCOPIC, PERIAORTIC/ DUODENAL MASS: FINDINGS CONSISTENT WITH NEUROENDOCRINE TUMOR. PLEASE SEE COMMENT.   ASSESSMENT AND PLAN:   Neuroendocrine tumor Pt does not want to have whipple, even at the risk of leaving the mass in place.  I advised him that I would respect his wishes in that regard.    We will plan dx laparoscopy, evaluation of pancreas and bowel with ultrasound and/or direct inspection.  If we find additional area of neuroendocrine tumor in small bowel, would resect that.        Maudry Diego, MD Surgical Oncology, General & Endocrine Surgery [facility]  [name], MD Marden Noble, MD

## 2012-12-05 ENCOUNTER — Encounter (HOSPITAL_COMMUNITY)
Admission: RE | Admit: 2012-12-05 | Discharge: 2012-12-05 | Disposition: A | Discharge status: Home / Self Care | Payer: PRIVATE HEALTH INSURANCE | Source: Ambulatory Visit | Attending: General Surgery | Admitting: General Surgery

## 2012-12-05 ENCOUNTER — Encounter (HOSPITAL_COMMUNITY): Payer: PRIVATE HEALTH INSURANCE

## 2012-12-05 DIAGNOSIS — R1900 Intra-abdominal and pelvic swelling, mass and lump, unspecified site: Secondary | ICD-10-CM | POA: Insufficient documentation

## 2012-12-06 ENCOUNTER — Encounter (HOSPITAL_COMMUNITY)
Admission: RE | Admit: 2012-12-06 | Discharge: 2012-12-06 | Disposition: A | Discharge status: Home / Self Care | Payer: PRIVATE HEALTH INSURANCE | Source: Ambulatory Visit | Attending: General Surgery | Admitting: General Surgery

## 2012-12-06 ENCOUNTER — Encounter (HOSPITAL_COMMUNITY): Payer: Self-pay

## 2012-12-06 ENCOUNTER — Encounter (HOSPITAL_COMMUNITY): Payer: PRIVATE HEALTH INSURANCE

## 2012-12-06 MED ORDER — INDIUM IN-111 PENTETREOTIDE IV KIT
6.3000 | PACK | Freq: Once | INTRAVENOUS | Status: AC | PRN
  Administered 2012-12-05: 6.3 via INTRAVENOUS

## 2012-12-07 ENCOUNTER — Encounter (HOSPITAL_COMMUNITY): Admission: RE | Admit: 2012-12-07 | Payer: PRIVATE HEALTH INSURANCE | Source: Ambulatory Visit

## 2012-12-07 ENCOUNTER — Encounter (HOSPITAL_COMMUNITY): Payer: PRIVATE HEALTH INSURANCE

## 2012-12-26 ENCOUNTER — Telehealth (INDEPENDENT_AMBULATORY_CARE_PROVIDER_SITE_OTHER): Payer: Self-pay | Admitting: General Surgery

## 2012-12-26 NOTE — Telephone Encounter (Signed)
Patient aware octreotide scan is negative. He will proceed with surgery and call with any questions.

## 2012-12-26 NOTE — Telephone Encounter (Signed)
Message copied by Liliana Cline on Wed Dec 26, 2012 12:17 PM ------      Message from: Milas Hock      Created: Wed Dec 26, 2012 12:11 PM      Contact: [phone]       Minta Balsam      ----- Message -----         From: Grace Isaac         Sent: 12/26/2012  11:13 AM           To: Milas Hock, CMA            Pt would like a call back in reference to the results of his scan on 5-8.      Thanks       ------

## 2013-01-01 ENCOUNTER — Other Ambulatory Visit (INDEPENDENT_AMBULATORY_CARE_PROVIDER_SITE_OTHER): Payer: Self-pay | Admitting: General Surgery

## 2013-01-01 ENCOUNTER — Other Ambulatory Visit (HOSPITAL_COMMUNITY): Payer: PRIVATE HEALTH INSURANCE

## 2013-01-02 ENCOUNTER — Encounter (HOSPITAL_COMMUNITY): Payer: Self-pay | Admitting: Pharmacy Technician

## 2013-01-02 ENCOUNTER — Telehealth (INDEPENDENT_AMBULATORY_CARE_PROVIDER_SITE_OTHER): Payer: Self-pay

## 2013-01-02 NOTE — Telephone Encounter (Signed)
Patient calling into office to report that he has had a cough, sore throat and sinus drainage for the last week.  Patient denies having any fever.  Patient plans to go to [facility] this afternoon for further assessment and treatment.  Patient advised to continue as planned to his pre-admit testing tomorrow. Patient will contact our office to give [place] an update from his office visit at [facility]. Patient scheduled for Removal Retroperitoneal Mass Intraoperative [place] on 01/10/13.

## 2013-01-02 NOTE — Pre-Procedure Instructions (Addendum)
[name]  01/02/2013   Your procedure is scheduled on:  Thursday, June 12th  Report to [facility] at 0530 AM. Come to main entrance "A" and go to east elevators up to 3rd floor. Check in at short stay desk.  Call this number if you have problems the morning of surgery: [phone]   Remember:   Do not eat food or drink liquids after midnight.   Take these medicines the morning of surgery with A SIP OF WATER: none Stop taking Aspirin, Coumadin, Plavix, Effient, and herbal medications. Do not take any NSAIDs ie: Ibuprofen, Advil, Naproxen or any medication containing Aspirin.  Do not wear jewelry, make-up or nail polish.  Do not wear lotions, powders, or perfume, deodorant.  Do not shave 48 hours prior to surgery. Men may shave face and neck.  Do not bring valuables to the hospital.  [facility] is not responsible for any belongings or valuables.  Contacts, dentures or bridgework may not be worn into surgery.  Leave suitcase in the car. After surgery it may be brought to your room.  For patients admitted to the hospital, checkout time is 11:00 AM the day of discharge.   Patients discharged the day of surgery will not be allowed to drive home.    Special Instructions: Shower using CHG 2 nights before surgery and the night before surgery.  If you shower the day of surgery use CHG.  Use special wash - you have one bottle of CHG for all showers.  You should use approximately 1/3 of the bottle for each shower.   Please read over the following fact sheets that you were given: Pain Booklet, Coughing and Deep Breathing, Blood Transfusion Information, MRSA Information and Surgical Site Infection Prevention

## 2013-01-03 ENCOUNTER — Encounter (HOSPITAL_COMMUNITY)
Admission: RE | Admit: 2013-01-03 | Discharge: 2013-01-03 | Disposition: A | Discharge status: Home / Self Care | Payer: PRIVATE HEALTH INSURANCE | Source: Ambulatory Visit | Attending: Anesthesiology | Admitting: Anesthesiology

## 2013-01-03 ENCOUNTER — Encounter (HOSPITAL_COMMUNITY): Payer: Self-pay

## 2013-01-03 ENCOUNTER — Encounter (HOSPITAL_COMMUNITY)
Admission: RE | Admit: 2013-01-03 | Discharge: 2013-01-03 | Disposition: A | Discharge status: Home / Self Care | Payer: PRIVATE HEALTH INSURANCE | Source: Ambulatory Visit | Attending: General Surgery | Admitting: General Surgery

## 2013-01-03 HISTORY — DX: Pneumonia, unspecified organism: J18.9

## 2013-01-03 LAB — SURGICAL PCR SCREEN
MRSA, PCR: NEGATIVE
Staphylococcus aureus: NEGATIVE

## 2013-01-03 LAB — COMPREHENSIVE METABOLIC PANEL
ALT: 21 U/L (ref 0–53)
AST: 23 U/L (ref 0–37)
Albumin: 3.8 g/dL (ref 3.5–5.2)
Alkaline Phosphatase: 71 U/L (ref 39–117)
Chloride: 98 mEq/L (ref 96–112)
GFR calc Af Amer: 65 mL/min — ABNORMAL LOW (ref >90)
Potassium: 4.3 mEq/L (ref 3.5–5.1)
Sodium: 136 mEq/L (ref 135–145)
Total Bilirubin: 0.5 mg/dL (ref 0.3–1.2)

## 2013-01-03 LAB — CBC WITH DIFFERENTIAL/PLATELET
Basophils Absolute: 0 10*3/uL (ref 0.0–0.1)
Basophils Relative: 0 % (ref 0–1)
Lymphocytes Relative: 21 % (ref 12–46)
MCHC: 35.5 g/dL (ref 30.0–36.0)
Neutro Abs: 4 10*3/uL (ref 1.7–7.7)
Neutrophils Relative %: 60 % (ref 43–77)
RDW: 13.2 % (ref 11.5–15.5)
WBC: 6.6 10*3/uL (ref 4.0–10.5)

## 2013-01-03 LAB — TYPE AND SCREEN: ABO/RH(D): A POS

## 2013-01-03 LAB — ABO/RH: ABO/RH(D): A POS

## 2013-01-03 NOTE — Progress Notes (Signed)
Pt denies SOB, chest pain, and being under the care of a cardiologist. Pt states that he experiences some discomfort in the bilateral lower lungs that radiates to the back when he coughs.. Pt states that he had been experiencing the discomfort accompanied with the cough for several days. Pt states that he went to see Dr. Henderson [place] at [place] yesterday (01/02/13) where he was given a prescription for Levofloxacin 500 mg and told that he didn't have PNA. pt awaiting EKG,chest x ray, and lab results.

## 2013-01-04 ENCOUNTER — Telehealth (INDEPENDENT_AMBULATORY_CARE_PROVIDER_SITE_OTHER): Payer: Self-pay | Admitting: General Surgery

## 2013-01-04 NOTE — Telephone Encounter (Signed)
         [place] ','<More Detail >>       [name], MD [phone])       Sent: Thu January 03, 2013 3:36 PM    To: June Leap                   Message     pls call pt tomorrow to see how he is feeling. His cxr and labs from today looked good even though he reported cough and sinus drainage.         Thanks    wilson    ----- Message -----    From: Ardeth Sportsman, MD    Sent: 01/03/2013 2:52 PM    To: [name], MD                         Attached Reports    The sender attached the following reports to this message:                Telephone Encounter KONNAR BEN (MR# [id number])      Telephone Encounter Info    Author Note Status Last Update User Last Update Date/Time   Maryan Puls, CMA Signed Maryan Puls, [facility] 01/02/2013 4:25 PM      Telephone Encounter    Patient calling into office to report that he has had a cough, sore throat and sinus drainage for the last week. Patient denies having any fever. Patient plans to go to [facility] this afternoon for further assessment and treatment. Patient advised to continue as planned to his pre-admit testing tomorrow. Patient will contact our office to give [place] an update from his office visit at [facility]. Patient scheduled for Removal Retroperitoneal Mass Intraoperative [place] on 01/10/13.       Called to see how patient was feeling today in which he stated he was feeling a lot better.Marland KitchenMarland KitchenI told him that his CXR and labs all looked fine per verbal orders from Dr.Wilson...  patient was pleased and will proceed with surgery on 01/10/13

## 2013-01-09 ENCOUNTER — Telehealth (INDEPENDENT_AMBULATORY_CARE_PROVIDER_SITE_OTHER): Payer: Self-pay | Admitting: General Surgery

## 2013-01-09 MED ORDER — CHLORHEXIDINE GLUCONATE 4 % EX LIQD: 1.0000 "application " | Freq: Once | CUTANEOUS | Status: DC

## 2013-01-09 MED ORDER — ALVIMOPAN 12 MG PO CAPS
12.0000 mg | ORAL_CAPSULE | Freq: Once | ORAL | Status: AC
  Administered 2013-01-10: 12 mg via ORAL
  Filled 2013-01-09 (×2): qty 1

## 2013-01-09 MED ORDER — CEFAZOLIN SODIUM-DEXTROSE 2-3 GM-% IV SOLR
2.0000 g | INTRAVENOUS | Status: AC
  Administered 2013-01-10: 2 g via INTRAVENOUS
  Filled 2013-01-09: qty 50

## 2013-01-09 NOTE — Telephone Encounter (Signed)
Just calling pt to see if he had any questions regarding surgery tomorrow. Did not leave message

## 2013-01-10 ENCOUNTER — Encounter (HOSPITAL_COMMUNITY): Payer: Self-pay | Admitting: Anesthesiology

## 2013-01-10 ENCOUNTER — Encounter (HOSPITAL_COMMUNITY): Admission: RE | Disposition: A | Discharge status: Home / Self Care | Payer: Self-pay | Source: Ambulatory Visit

## 2013-01-10 ENCOUNTER — Inpatient Hospital Stay (HOSPITAL_COMMUNITY)
Admission: RE | Admit: 2013-01-10 | Discharge: 2013-01-20 | DRG: 348 | Disposition: A | Discharge status: Home / Self Care | Payer: PRIVATE HEALTH INSURANCE | Source: Ambulatory Visit | Attending: General Surgery | Admitting: General Surgery

## 2013-01-10 ENCOUNTER — Encounter (HOSPITAL_COMMUNITY): Payer: Self-pay | Admitting: *Deleted

## 2013-01-10 ENCOUNTER — Inpatient Hospital Stay (HOSPITAL_COMMUNITY): Payer: PRIVATE HEALTH INSURANCE | Admitting: Anesthesiology

## 2013-01-10 DIAGNOSIS — I10 Essential (primary) hypertension: Secondary | ICD-10-CM | POA: Diagnosis present

## 2013-01-10 DIAGNOSIS — Q430 Meckel's diverticulum (displaced) (hypertrophic): Secondary | ICD-10-CM

## 2013-01-10 DIAGNOSIS — Z01810 Encounter for preprocedural cardiovascular examination: Secondary | ICD-10-CM

## 2013-01-10 DIAGNOSIS — R599 Enlarged lymph nodes, unspecified: Secondary | ICD-10-CM | POA: Diagnosis present

## 2013-01-10 DIAGNOSIS — Z01818 Encounter for other preprocedural examination: Secondary | ICD-10-CM

## 2013-01-10 DIAGNOSIS — C7490 Malignant neoplasm of unspecified part of unspecified adrenal gland: Secondary | ICD-10-CM

## 2013-01-10 DIAGNOSIS — E871 Hypo-osmolality and hyponatremia: Secondary | ICD-10-CM | POA: Diagnosis present

## 2013-01-10 DIAGNOSIS — Y838 Other surgical procedures as the cause of abnormal reaction of the patient, or of later complication, without mention of misadventure at the time of the procedure: Secondary | ICD-10-CM | POA: Diagnosis not present

## 2013-01-10 DIAGNOSIS — D3A8 Other benign neuroendocrine tumors: Secondary | ICD-10-CM | POA: Diagnosis present

## 2013-01-10 DIAGNOSIS — K929 Disease of digestive system, unspecified: Secondary | ICD-10-CM | POA: Diagnosis not present

## 2013-01-10 DIAGNOSIS — R609 Edema, unspecified: Secondary | ICD-10-CM | POA: Diagnosis present

## 2013-01-10 DIAGNOSIS — M109 Gout, unspecified: Secondary | ICD-10-CM | POA: Diagnosis present

## 2013-01-10 DIAGNOSIS — K560 Paralytic ileus: Secondary | ICD-10-CM | POA: Diagnosis not present

## 2013-01-10 DIAGNOSIS — D483 Neoplasm of uncertain behavior of retroperitoneum: Principal | ICD-10-CM | POA: Diagnosis present

## 2013-01-10 DIAGNOSIS — Z01812 Encounter for preprocedural laboratory examination: Secondary | ICD-10-CM

## 2013-01-10 DIAGNOSIS — M5380 Other specified dorsopathies, site unspecified: Secondary | ICD-10-CM | POA: Diagnosis not present

## 2013-01-10 HISTORY — PX: LAPAROSCOPIC LIVER ULTRASOUND: SHX5902

## 2013-01-10 HISTORY — DX: Malignant (primary) neoplasm, unspecified: C80.1

## 2013-01-10 HISTORY — PX: LAPAROSCOPY: SHX197

## 2013-01-10 HISTORY — PX: RETROPERITONEAL MASS EXCISION: SHX2342

## 2013-01-10 SURGERY — LAPAROSCOPY, DIAGNOSTIC
Anesthesia: General | Site: Abdomen | Wound class: Clean

## 2013-01-10 MED ORDER — NEOSTIGMINE METHYLSULFATE 1 MG/ML IJ SOLN
INTRAMUSCULAR | Status: DC | PRN
  Administered 2013-01-10: 5 mg via INTRAVENOUS

## 2013-01-10 MED ORDER — HYDROMORPHONE 0.3 MG/ML IV SOLN
INTRAVENOUS | Status: AC
  Filled 2013-01-10: qty 25

## 2013-01-10 MED ORDER — PANTOPRAZOLE SODIUM 40 MG IV SOLR
40.0000 mg | INTRAVENOUS | Status: DC
  Administered 2013-01-10 – 2013-01-17 (×8): 40 mg via INTRAVENOUS
  Filled 2013-01-10 (×11): qty 40

## 2013-01-10 MED ORDER — EPHEDRINE SULFATE 50 MG/ML IJ SOLN
INTRAMUSCULAR | Status: DC | PRN
  Administered 2013-01-10: 10 mg via INTRAVENOUS

## 2013-01-10 MED ORDER — BUPIVACAINE-EPINEPHRINE 0.25% -1:200000 IJ SOLN
INTRAMUSCULAR | Status: DC | PRN
  Administered 2013-01-10: 9 mL

## 2013-01-10 MED ORDER — SODIUM CHLORIDE 0.9 % IJ SOLN: 9.0000 mL | INTRAMUSCULAR | Status: DC | PRN

## 2013-01-10 MED ORDER — METRONIDAZOLE IN NACL 5-0.79 MG/ML-% IV SOLN
500.0000 mg | Freq: Once | INTRAVENOUS | Status: AC
  Administered 2013-01-10: 500 mg via INTRAVENOUS
  Filled 2013-01-10: qty 100

## 2013-01-10 MED ORDER — GLYCOPYRROLATE 0.2 MG/ML IJ SOLN
INTRAMUSCULAR | Status: DC | PRN
  Administered 2013-01-10: 0.6 mg via INTRAVENOUS

## 2013-01-10 MED ORDER — HEMOSTATIC AGENTS (NO CHARGE) OPTIME
TOPICAL | Status: DC | PRN
  Administered 2013-01-10: 1 via TOPICAL

## 2013-01-10 MED ORDER — HYDROMORPHONE 0.3 MG/ML IV SOLN
INTRAVENOUS | Status: DC
  Administered 2013-01-10: 3 mg via INTRAVENOUS
  Administered 2013-01-10: via INTRAVENOUS
  Administered 2013-01-10: 1.5 mg via INTRAVENOUS
  Administered 2013-01-10: 23 mL via INTRAVENOUS
  Administered 2013-01-11: 5.7 mg via INTRAVENOUS
  Administered 2013-01-11: 0.737 mg via INTRAVENOUS
  Administered 2013-01-11: 19:00:00 via INTRAVENOUS
  Administered 2013-01-11: 6 mg via INTRAVENOUS
  Administered 2013-01-11: 08:00:00 via INTRAVENOUS
  Administered 2013-01-12: 3.6 mg via INTRAVENOUS
  Administered 2013-01-12 (×2): 1.5 mg via INTRAVENOUS
  Administered 2013-01-12: 08:00:00 via INTRAVENOUS
  Administered 2013-01-12: 1.65 mg via INTRAVENOUS
  Administered 2013-01-12: 1.5 mg via INTRAVENOUS
  Administered 2013-01-12: 0.9 mg via INTRAVENOUS
  Administered 2013-01-13: 2.7 mg via INTRAVENOUS
  Administered 2013-01-13: 2.1 mg via INTRAVENOUS
  Administered 2013-01-13: 1.8 mg via INTRAVENOUS
  Filled 2013-01-10 (×5): qty 25

## 2013-01-10 MED ORDER — 0.9 % SODIUM CHLORIDE (POUR BTL) OPTIME
TOPICAL | Status: DC | PRN
  Administered 2013-01-10: 2000 mL
  Administered 2013-01-10: 1000 mL

## 2013-01-10 MED ORDER — HYDROMORPHONE HCL PF 1 MG/ML IJ SOLN
INTRAMUSCULAR | Status: AC
  Filled 2013-01-10: qty 2

## 2013-01-10 MED ORDER — ROCURONIUM BROMIDE 100 MG/10ML IV SOLN
INTRAVENOUS | Status: DC | PRN
  Administered 2013-01-10: 50 mg via INTRAVENOUS
  Administered 2013-01-10 (×2): 20 mg via INTRAVENOUS

## 2013-01-10 MED ORDER — ALBUMIN HUMAN 5 % IV SOLN
INTRAVENOUS | Status: DC | PRN
  Administered 2013-01-10: 08:00:00 via INTRAVENOUS

## 2013-01-10 MED ORDER — PHENYLEPHRINE HCL 10 MG/ML IJ SOLN
INTRAMUSCULAR | Status: DC | PRN
  Administered 2013-01-10 (×2): 40 ug via INTRAVENOUS

## 2013-01-10 MED ORDER — BUPIVACAINE ON-Q PAIN PUMP (FOR ORDER SET NO CHG)
INJECTION | Status: DC
  Filled 2013-01-10: qty 1

## 2013-01-10 MED ORDER — DIPHENHYDRAMINE HCL 12.5 MG/5ML PO ELIX
12.5000 mg | ORAL_SOLUTION | Freq: Four times a day (QID) | ORAL | Status: DC | PRN
  Filled 2013-01-10: qty 5

## 2013-01-10 MED ORDER — FENTANYL CITRATE 0.05 MG/ML IJ SOLN
INTRAMUSCULAR | Status: DC | PRN
  Administered 2013-01-10 (×2): 100 ug via INTRAVENOUS
  Administered 2013-01-10 (×2): 150 ug via INTRAVENOUS
  Administered 2013-01-10: 100 ug via INTRAVENOUS
  Administered 2013-01-10: 150 ug via INTRAVENOUS

## 2013-01-10 MED ORDER — LACTATED RINGERS IV SOLN
INTRAVENOUS | Status: DC | PRN
  Administered 2013-01-10 (×3): via INTRAVENOUS

## 2013-01-10 MED ORDER — LACTATED RINGERS IV SOLN
INTRAVENOUS | Status: DC | PRN
  Administered 2013-01-10: 08:00:00 via INTRAVENOUS

## 2013-01-10 MED ORDER — ONDANSETRON HCL 4 MG/2ML IJ SOLN
4.0000 mg | Freq: Four times a day (QID) | INTRAMUSCULAR | Status: DC | PRN
  Administered 2013-01-14 – 2013-01-15 (×5): 4 mg via INTRAVENOUS
  Filled 2013-01-10 (×7): qty 2

## 2013-01-10 MED ORDER — PHENYLEPHRINE HCL 10 MG/ML IJ SOLN
10.0000 mg | INTRAVENOUS | Status: DC | PRN
  Administered 2013-01-10: 10 ug/min via INTRAVENOUS

## 2013-01-10 MED ORDER — NALOXONE HCL 0.4 MG/ML IJ SOLN
0.4000 mg | INTRAMUSCULAR | Status: DC | PRN
  Filled 2013-01-10: qty 1

## 2013-01-10 MED ORDER — DIPHENHYDRAMINE HCL 50 MG/ML IJ SOLN
12.5000 mg | Freq: Four times a day (QID) | INTRAMUSCULAR | Status: DC | PRN
  Filled 2013-01-10: qty 0.25

## 2013-01-10 MED ORDER — HYDROMORPHONE HCL PF 1 MG/ML IJ SOLN
0.2500 mg | INTRAMUSCULAR | Status: DC | PRN
  Administered 2013-01-10 (×4): 0.5 mg via INTRAVENOUS

## 2013-01-10 MED ORDER — BUPIVACAINE-EPINEPHRINE PF 0.25-1:200000 % IJ SOLN
INTRAMUSCULAR | Status: AC
  Filled 2013-01-10: qty 30

## 2013-01-10 MED ORDER — LABETALOL HCL 5 MG/ML IV SOLN
INTRAVENOUS | Status: DC | PRN
  Administered 2013-01-10: 5 mg via INTRAVENOUS
  Administered 2013-01-10: 10 mg via INTRAVENOUS
  Administered 2013-01-10: 5 mg via INTRAVENOUS

## 2013-01-10 MED ORDER — ENOXAPARIN SODIUM 40 MG/0.4ML ~~LOC~~ SOLN
40.0000 mg | SUBCUTANEOUS | Status: DC
  Administered 2013-01-11 – 2013-01-20 (×10): 40 mg via SUBCUTANEOUS
  Filled 2013-01-10 (×12): qty 0.4

## 2013-01-10 MED ORDER — ONDANSETRON HCL 4 MG/2ML IJ SOLN
INTRAMUSCULAR | Status: DC | PRN
  Administered 2013-01-10: 4 mg via INTRAVENOUS

## 2013-01-10 MED ORDER — KCL IN DEXTROSE-NACL 20-5-0.45 MEQ/L-%-% IV SOLN
INTRAVENOUS | Status: DC
  Administered 2013-01-10 – 2013-01-18 (×17): via INTRAVENOUS
  Filled 2013-01-10 (×28): qty 1000

## 2013-01-10 MED ORDER — BUPIVACAINE 0.25 % ON-Q PUMP DUAL CATH 300 ML
300.0000 mL | INJECTION | Status: AC
  Filled 2013-01-10: qty 300

## 2013-01-10 SURGICAL SUPPLY — 72 items
BLADE SURG ROTATE 9660 (MISCELLANEOUS) IMPLANT
CANISTER SUCTION 2500CC (MISCELLANEOUS) ×3 IMPLANT
CATH KIT ON Q 7.5IN SLV (PAIN MANAGEMENT) ×6 IMPLANT
CHLORAPREP W/TINT 26ML (MISCELLANEOUS) ×3 IMPLANT
CLIP TI LARGE 6 (CLIP) ×6 IMPLANT
CLIP TI MEDIUM 6 (CLIP) ×3 IMPLANT
CLIP TI WIDE RED SMALL 6 (CLIP) ×3 IMPLANT
CLOTH BEACON ORANGE TIMEOUT ST (SAFETY) ×3 IMPLANT
COVER SURGICAL LIGHT HANDLE (MISCELLANEOUS) ×3 IMPLANT
DECANTER SPIKE VIAL GLASS SM (MISCELLANEOUS) IMPLANT
DERMABOND ADVANCED (GAUZE/BANDAGES/DRESSINGS)
DERMABOND ADVANCED .7 DNX12 (GAUZE/BANDAGES/DRESSINGS) IMPLANT
DRAPE LAPAROSCOPIC ABDOMINAL (DRAPES) ×3 IMPLANT
DRAPE UTILITY 15X26 W/TAPE STR (DRAPE) ×6 IMPLANT
DRAPE WARM FLUID 44X44 (DRAPE) ×6 IMPLANT
DRSG TEGADERM 4X4.75 (GAUZE/BANDAGES/DRESSINGS) ×3 IMPLANT
ELECT BLADE 6.5 EXT (BLADE) ×3 IMPLANT
ELECT CAUTERY BLADE 6.4 (BLADE) ×3 IMPLANT
ELECT REM PT RETURN 9FT ADLT (ELECTROSURGICAL) ×3
ELECTRODE REM PT RTRN 9FT ADLT (ELECTROSURGICAL) ×2 IMPLANT
GLOVE BIO SURGEON STRL SZ7.5 (GLOVE) ×6 IMPLANT
GLOVE BIOGEL M STRL SZ7.5 (GLOVE) ×6 IMPLANT
GLOVE BIOGEL PI IND STRL 7.0 (GLOVE) ×2 IMPLANT
GLOVE BIOGEL PI IND STRL 7.5 (GLOVE) ×2 IMPLANT
GLOVE BIOGEL PI IND STRL 8 (GLOVE) ×2 IMPLANT
GLOVE BIOGEL PI INDICATOR 7.0 (GLOVE) ×1
GLOVE BIOGEL PI INDICATOR 7.5 (GLOVE) ×1
GLOVE BIOGEL PI INDICATOR 8 (GLOVE) ×1
GLOVE SURG SS PI 7.0 STRL IVOR (GLOVE) ×3 IMPLANT
GOWN PREVENTION PLUS XLARGE (GOWN DISPOSABLE) ×3 IMPLANT
GOWN PREVENTION PLUS XXLARGE (GOWN DISPOSABLE) ×3 IMPLANT
GOWN STRL NON-REIN LRG LVL3 (GOWN DISPOSABLE) ×6 IMPLANT
HEMOSTAT SURGICEL 2X4 FIBR (HEMOSTASIS) ×3 IMPLANT
KIT BASIN OR (CUSTOM PROCEDURE TRAY) ×3 IMPLANT
KIT ROOM TURNOVER OR (KITS) ×3 IMPLANT
LIGASURE IMPACT 36 18CM CVD LR (INSTRUMENTS) IMPLANT
NS IRRIG 1000ML POUR BTL (IV SOLUTION) ×6 IMPLANT
PACK GENERAL/GYN (CUSTOM PROCEDURE TRAY) ×3 IMPLANT
PAD ARMBOARD 7.5X6 YLW CONV (MISCELLANEOUS) ×6 IMPLANT
PENCIL BUTTON HOLSTER BLD 10FT (ELECTRODE) ×3 IMPLANT
SCALPEL HARMONIC ACE (MISCELLANEOUS) IMPLANT
SCISSORS LAP 5X35 DISP (ENDOMECHANICALS) IMPLANT
SET IRRIG TUBING LAPAROSCOPIC (IRRIGATION / IRRIGATOR) IMPLANT
SHEARS FOC LG CVD HARMONIC 17C (MISCELLANEOUS) ×3 IMPLANT
SLEEVE ENDOPATH XCEL 5M (ENDOMECHANICALS) ×3 IMPLANT
SPECIMEN JAR LARGE (MISCELLANEOUS) IMPLANT
SPONGE GAUZE 4X4 12PLY (GAUZE/BANDAGES/DRESSINGS) ×3 IMPLANT
SPONGE INTESTINAL PEANUT (DISPOSABLE) ×3 IMPLANT
SPONGE LAP 18X18 X RAY DECT (DISPOSABLE) ×6 IMPLANT
STAPLER VISISTAT 35W (STAPLE) ×3 IMPLANT
STRIP CLOSURE SKIN 1/2X4 (GAUZE/BANDAGES/DRESSINGS) ×3 IMPLANT
SUCTION POOLE TIP (SUCTIONS) ×3 IMPLANT
SUT MNCRL AB 4-0 PS2 18 (SUTURE) ×3 IMPLANT
SUT PDS AB 1 TP1 96 (SUTURE) IMPLANT
SUT SILK 2 0 SH CR/8 (SUTURE) ×3 IMPLANT
SUT SILK 2 0 TIES 10X30 (SUTURE) ×3 IMPLANT
SUT SILK 3 0 SH CR/8 (SUTURE) ×3 IMPLANT
SUT SILK 3 0 TIES 10X30 (SUTURE) ×3 IMPLANT
SUT VIC AB 3-0 SH 18 (SUTURE) IMPLANT
TAPE CLOTH SURG 4X10 WHT LF (GAUZE/BANDAGES/DRESSINGS) ×3 IMPLANT
TOWEL OR 17X24 6PK STRL BLUE (TOWEL DISPOSABLE) ×6 IMPLANT
TOWEL OR 17X26 10 PK STRL BLUE (TOWEL DISPOSABLE) ×3 IMPLANT
TRAY FOLEY CATH 14FRSI W/METER (CATHETERS) ×3 IMPLANT
TRAY LAPAROSCOPIC (CUSTOM PROCEDURE TRAY) ×3 IMPLANT
TROCAR XCEL 12X100 BLDLESS (ENDOMECHANICALS) ×3 IMPLANT
TROCAR XCEL BLUNT TIP 100MML (ENDOMECHANICALS) ×3 IMPLANT
TROCAR XCEL NON-BLD 11X100MML (ENDOMECHANICALS) IMPLANT
TROCAR XCEL NON-BLD 5MMX100MML (ENDOMECHANICALS) ×3 IMPLANT
TUBE CONNECTING 12X1/4 (SUCTIONS) ×3 IMPLANT
TUNNELER SHEATH ON-Q 11GX12 (MISCELLANEOUS) ×3 IMPLANT
WATER STERILE IRR 1000ML POUR (IV SOLUTION) IMPLANT
YANKAUER SUCT BULB TIP NO VENT (SUCTIONS) ×3 IMPLANT

## 2013-01-10 NOTE — H&P (Signed)
[name] is an 62 y.o. male.   Chief Complaint: here for surgery HPI:  62 year old Caucasian male comes in to discuss the results of his endoscopic ultrasound and biopsy of a retroperitoneal mass. I initially saw the patient in October 2013 after an incidental mass was found around his aorta and vena cava on a CT scan that was ordered to evaluate for microscopic hematuria. It was near his third portion of his duodenum. At that time we recommended upper endoscopy with EUS and biopsy. He saw the gastroenterologist but declined evaluation at that time. He subsequently changed his mind and underwent endoscopic ultrasound on March 26 which showed a 33 x 34 mm hypoechoic lobulated lesion causing extrinsic compression onto the C-loop of the duodenum. It was biopsied which showed a well-differentiated neuroendocrine tumor grade 1. He is here to discuss biopsy results and plan  Since he was last seen he denies any medical changes. He denies any abdominal pain, nausea, vomiting, diarrhea or constipation. He denies any flushing. He denies any weight change. He denies any fevers or chills. He denies any palpitations. He has had a colonoscopy a few years ago which she reports was normal.   Past Medical History  Diagnosis Date  . Hypertension   . Gout   . Pneumonia     Past Surgical History  Procedure Laterality Date  . Inguinal hernia repair  1985    right  . Esophagogastroduodenoscopy N/A 10/24/2012    Procedure: ESOPHAGOGASTRODUODENOSCOPY (EGD);  Surgeon: Willis Modena, MD;  Location: [facility];  Service: Endoscopy;  Laterality: N/A;  . Eus N/A 10/24/2012    Procedure: ESOPHAGEAL ENDOSCOPIC ULTRASOUND (EUS) RADIAL;  Surgeon: Willis Modena, MD;  Location: [facility];  Service: Endoscopy;  Laterality: N/A;  + or - FNA  . Fine needle aspiration N/A 10/24/2012    Procedure: FINE NEEDLE ASPIRATION (FNA) LINEAR;  Surgeon: Willis Modena, MD;  Location: [facility];  Service: Endoscopy;  Laterality:  N/A;  . Tonsillectomy    . Colonoscopy      Hx: of    Family History  Problem Relation Age of Onset  . Heart disease Father    Social History:  reports that he has never smoked. He has never used smokeless tobacco. He reports that  drinks alcohol. He reports that he does not use illicit drugs.  Allergies:  Allergies  Allergen Reactions  . Sulfa Antibiotics Itching    Medications Prior to Admission  Medication Sig Dispense Refill  . ibuprofen (ADVIL,MOTRIN) 200 MG tablet Take 400 mg by mouth every 6 (six) hours as needed for pain or headache.      . levofloxacin (LEVAQUIN) 500 MG tablet Take 500 mg by mouth daily.      . valsartan-hydrochlorothiazide (DIOVAN-HCT) 160-12.5 MG per tablet Take 1 tablet by mouth every morning.        No results found for this or any previous visit (from the past 48 hour(s)). No results found.  Review of Systems  Constitutional: Negative for fever and chills.  HENT: Positive for congestion and sore throat. Negative for hearing loss.   Eyes: Negative for blurred vision and double vision.  Respiratory: Positive for cough and sputum production. Negative for shortness of breath.        Pt had recent upper tract infection about 14 days ago with cough, sputum production, congestion. Placed on ABX. Has 2 days left. However, no sputum, cough, congestion for past 5 days. Feels normal. No SOB  Cardiovascular: Negative for chest pain,  palpitations, orthopnea, leg swelling and PND.  Gastrointestinal: Negative for nausea, vomiting and abdominal pain.  Genitourinary: Negative for dysuria and urgency.  Musculoskeletal: Negative for back pain.  Neurological: Negative for dizziness, seizures, loss of consciousness and weakness.  Psychiatric/Behavioral: Negative for substance abuse.    Blood pressure 155/102, pulse 58, temperature 97.1 F (36.2 C), temperature source Oral, resp. rate 20, SpO2 97.00%. Physical Exam  Vitals reviewed. Constitutional: He is  oriented to person, place, and time. He appears well-developed and well-nourished. No distress.  HENT:  Head: Normocephalic and atraumatic.  Right Ear: External ear normal.  Left Ear: External ear normal.  Eyes: Conjunctivae are normal. No scleral icterus.  Neck: No tracheal deviation present.  Cardiovascular: Normal rate and intact distal pulses.   Respiratory: Effort normal and breath sounds normal. No stridor. No respiratory distress.  GI: Soft. He exhibits no distension. There is no tenderness. There is no rebound.  Musculoskeletal: He exhibits no edema and no tenderness.  Lymphadenopathy:    He has no cervical adenopathy.  Neurological: He is alert and oriented to person, place, and time. He exhibits normal muscle tone.  Skin: Skin is warm and dry. No rash noted. He is not diaphoretic. No erythema.  Psychiatric: He has a normal mood and affect. His behavior is normal. Judgment and thought content normal.     Assessment/Plan Retroperitoneal mass c/w neuroendocrine tumor  To OR for dx lap, lap ultrasound, excision of retroperitoneal mass,  If mass can not be enucleated pt has decided he does not want a Whipple procedure but is amenable to bowel resection if needed as long as it doesn't require a Whipple  [name]. Andrey Campanile, MD, FACS General, Bariatric, & Minimally Invasive Surgery [facility], [place]   [facility] M 01/10/2013, 7:13 AM

## 2013-01-10 NOTE — Anesthesia Postprocedure Evaluation (Signed)
  Anesthesia Post-op Note  Patient: [name]  Procedure(s) Performed: Procedure(s): LAPAROSCOPY DIAGNOSTIC (N/A) RESECTION OF MALIGNANT RETROPERITONEAL MASS (N/A) LAPAROSCOPIC LIVER ULTRASOUND (N/A)  Patient Location: PACU  Anesthesia Type:General  Level of Consciousness: awake  Airway and Oxygen Therapy: Patient Spontanous Breathing  Post-op Pain: mild  Post-op Assessment: Post-op Vital signs reviewed  Post-op Vital Signs: Reviewed  Complications: No apparent anesthesia complications

## 2013-01-10 NOTE — OR Nursing (Signed)
Late note entered in medications and dressings.

## 2013-01-10 NOTE — Transfer of Care (Signed)
Immediate Anesthesia Transfer of Care Note  Patient: [name]  Procedure(s) Performed: Procedure(s): LAPAROSCOPY DIAGNOSTIC (N/A) RESECTION OF MALIGNANT RETROPERITONEAL MASS (N/A) LAPAROSCOPIC LIVER ULTRASOUND (N/A)  Patient Location: PACU  Anesthesia Type:General  Level of Consciousness: awake, sedated and patient cooperative  Airway & Oxygen Therapy: Patient Spontanous Breathing and Patient connected to nasal cannula oxygen  Post-op Assessment: Report given to PACU RN, Post -op Vital signs reviewed and stable, Patient moving all extremities and Patient moving all extremities X 4  Post vital signs: Reviewed and stable  Complications: No apparent anesthesia complications

## 2013-01-10 NOTE — Addendum Note (Signed)
Addendum created 01/10/13 1442 by Coralee Rud, CRNA   Modules edited: Anesthesia LDA

## 2013-01-10 NOTE — Brief Op Note (Signed)
01/10/2013  10:36 AM  PATIENT:  [name]  62 y.o. male  PRE-OPERATIVE DIAGNOSIS:  Retroperitoneal malignant neuroendocrine tumor  POST-OPERATIVE DIAGNOSIS:  retroperitoneal malignant neuroendocrine tumor; meckel's diverticulum  PROCEDURE:  Procedure(s): LAPAROSCOPY DIAGNOSTIC (N/A) RESECTION OF MALIGNANT RETROPERITONEAL MASS (N/A) LAPAROSCOPIC LIVER ULTRASOUND (N/A) Resection of meckels diverticulum   SURGEON:  Surgeon(s) and Role:    * [name], MD - Primary    * Almond Lint, MD - Assisting  PHYSICIAN ASSISTANT: none  ASSISTANTS: see above   ANESTHESIA:   general  EBL:  Total I/O In: 2950 [I.V.:2700; IV Piggyback:250] Out: 450 [Urine:400; Blood:50]  BLOOD ADMINISTERED:none  DRAINS: Nasogastric Tube, Urinary Catheter (Foley) and On - Q pain pump catheters   LOCAL MEDICATIONS USED:  MARCAINE     SPECIMEN:  Source of Specimen:  retroperitoneal malignant mass; small bowel mesentery lymph node; meckels diverticulum  DISPOSITION OF SPECIMEN:  PATHOLOGY  COUNTS:  YES  TOURNIQUET:  * No tourniquets in log *  DICTATION: .Other Dictation: Dictation Number [id number]  PLAN OF CARE: Admit to inpatient   PATIENT DISPOSITION:  PACU - hemodynamically stable.   Delay start of Pharmacological VTE agent (>24hrs) due to surgical blood loss or risk of bleeding: no

## 2013-01-10 NOTE — Op Note (Signed)
PRE-OPERATIVE DIAGNOSIS: Neuroendocrine tumor  POST-OPERATIVE DIAGNOSIS:  Same  PROCEDURE:  Procedure(s): Intraoperative laparoscopic liver ultrasound  SURGEON:  Surgeon(s): Almond Lint, MD  ASSIST:  Surgeon Gaynelle Adu, MD  ANESTHESIA:   general  DRAINS: none   LOCAL MEDICATIONS USED:  MARCAINE     SPECIMEN:  No Specimen  DISPOSITION OF SPECIMEN:  N/A  PLAN OF CARE: Admit to inpatient   PATIENT DISPOSITION:  PACU - hemodynamically stable.  PROCEDURE:   Dr. Andrey Campanile was performing diagnostic laparoscopy and planning on proceeding to open resection of abdominal mass.  He requested liver ultrasound to look for metastases and for ultrasound localization of the mass.  The liver was scanned sequentially from right to left.  The portal structures and hepatic veins were visualized. The right kidney was visualized.  There were no masses seen in the liver.  The sweep of the duodenum was localized, and the mass was directly inferior.  The mass was measured at 3.7 cm which was consistent with the CT scan.  It was difficult to tell if it was adherent to bowel wall on ultrasound.  The ultrasound portion was terminated and Dr. Andrey Campanile continued his procedure.

## 2013-01-10 NOTE — Anesthesia Preprocedure Evaluation (Signed)
Anesthesia Evaluation  Patient identified by MRN, date of birth, ID band Patient awake    Reviewed: Allergy & Precautions, H&P , NPO status , Patient's Chart, lab work & pertinent test results  Airway Mallampati: II      Dental   Pulmonary pneumonia -, resolved,  breath sounds clear to auscultation        Cardiovascular hypertension, Pt. on medications Rhythm:Regular Rate:Normal     Neuro/Psych    GI/Hepatic negative GI ROS, Neg liver ROS,   Endo/Other  negative endocrine ROS  Renal/GU negative Renal ROS     Musculoskeletal   Abdominal   Peds  Hematology   Anesthesia Other Findings   Reproductive/Obstetrics                           Anesthesia Physical Anesthesia Plan  ASA: III  Anesthesia Plan: General   Post-op Pain Management:    Induction:   Airway Management Planned: Oral ETT  Additional Equipment:   Intra-op Plan:   Post-operative Plan: Possible Post-op intubation/ventilation  Informed Consent: I have reviewed the patients History and Physical, chart, labs and discussed the procedure including the risks, benefits and alternatives for the proposed anesthesia with the patient or authorized representative who has indicated his/her understanding and acceptance.   Dental advisory given  Plan Discussed with: CRNA, Anesthesiologist and Surgeon  Anesthesia Plan Comments:         Anesthesia Quick Evaluation

## 2013-01-10 NOTE — Progress Notes (Signed)
Aline removed intact, pt tol well.

## 2013-01-11 LAB — BASIC METABOLIC PANEL
BUN: 19 mg/dL (ref 6–23)
Calcium: 8.4 mg/dL (ref 8.4–10.5)
Chloride: 99 mEq/L (ref 96–112)
Creatinine, Ser: 1.31 mg/dL (ref 0.50–1.35)
GFR calc Af Amer: 65 mL/min — ABNORMAL LOW (ref >90)

## 2013-01-11 LAB — CBC
HCT: 37.4 % — ABNORMAL LOW (ref 39.0–52.0)
MCH: 29.8 pg (ref 26.0–34.0)
MCHC: 33.4 g/dL (ref 30.0–36.0)
MCV: 89 fL (ref 78.0–100.0)
RDW: 13.3 % (ref 11.5–15.5)
WBC: 9.2 10*3/uL (ref 4.0–10.5)

## 2013-01-11 MED ORDER — ACETAMINOPHEN 325 MG PO TABS
650.0000 mg | ORAL_TABLET | Freq: Four times a day (QID) | ORAL | Status: DC | PRN
  Administered 2013-01-11 – 2013-01-20 (×3): 650 mg via ORAL
  Filled 2013-01-11 (×4): qty 2

## 2013-01-11 NOTE — Progress Notes (Signed)
1 Day Post-Op  Subjective: Sore. No flatus. Pain ok. Doing IS.   Objective: Vital signs in last 24 hours: Temp:  [97.7 F (36.5 C)-101.8 F (38.8 C)] 99.7 F (37.6 C) (06/13 1101) Pulse Rate:  [75-85] 82 (06/13 0900) Resp:  [10-18] 16 (06/13 0900) BP: (108-131)/(72-87) 129/84 mmHg (06/13 0900) SpO2:  [92 %-99 %] 95 % (06/13 0900) Weight:  [167 lb 8 oz (75.978 kg)] 167 lb 8 oz (75.978 kg) (06/13 0700) Last BM Date: 01/10/13  Intake/Output from previous day: 06/12 0701 - 06/13 0700 In: 4200 [I.V.:3950; IV Piggyback:250] Out: 1650 [Urine:1600; Blood:50] Intake/Output this shift:    Alert, NAD, resting comfortably cta b/l Reg Soft, mild distension. No BS. Dressing C/D/I. On-q pain pump intact +SCDs  Lab Results:   Recent Labs  01/11/13 0550  WBC 9.2  HGB 12.5*  HCT 37.4*  PLT 213   BMET  Recent Labs  01/11/13 0550  NA 134*  K 4.6  CL 99  CO2 30  GLUCOSE 118*  BUN 19  CREATININE 1.31  CALCIUM 8.4   PT/INR No results found for this basename: LABPROT, INR,  in the last 72 hours ABG No results found for this basename: PHART, PCO2, PO2, HCO3,  in the last 72 hours  Studies/Results: No results found.  Anti-infectives: Anti-infectives   Start     Dose/Rate Route Frequency Ordered Stop   01/10/13 1100  metroNIDAZOLE (FLAGYL) IVPB 500 mg     500 mg 100 mL/hr over 60 Minutes Intravenous  Once 01/10/13 1050 01/10/13 1344   01/10/13 0600  ceFAZolin (ANCEF) IVPB 2 g/50 mL premix     2 g 100 mL/hr over 30 Minutes Intravenous On call to O.R. 01/09/13 1420 01/10/13 0740      Assessment/Plan: s/p Procedure(s): LAPAROSCOPY DIAGNOSTIC (N/A) RESECTION OF MALIGNANT RETROPERITONEAL MASS (N/A) LAPAROSCOPIC LIVER ULTRASOUND (N/A) RESECTION OF MECKEL'S DIVERTICULUM  d/c foley IS, Pulm toilet, OOB, ambulate  Pt had upper tract infection last week & on levaquin. Stressed the need for pulm toilet. Will add flutter valve Cont NG today, min output, will leave  another day due to extensive mobilization of SB; if not much out overnight, will removed on POD 2 Cont on-q pain catheters - will be ready for removal on Sunday  Blaike Vickers M. Andrey Campanile, MD, FACS General, Bariatric, & Minimally Invasive Surgery [facility], [place]    LOS: 1 day    [name] 01/11/2013

## 2013-01-11 NOTE — Op Note (Signed)
[name], [name] NO.:  [id number]  MEDICAL RECORD NO.:  [id number]  LOCATION:  [id number]                        FACILITY:  [facility]  PHYSICIAN:  [name]. Andrey Campanile, MD, FACSDATE OF BIRTH:  [birth year]  DATE OF PROCEDURE:  01/10/2013 DATE OF DISCHARGE:                              OPERATIVE REPORT   PREOPERATIVE DIAGNOSIS:  Retroperitoneal malignant neuroendocrine tumor.  POSTOPERATIVE DIAGNOSES: 1. Retroperitoneal malignant neuroendocrine tumor. 2. Meckel's diverticulum.  PROCEDURE: 1. Diagnostic laparoscopy. 2. Laparoscopic liver ultrasound. 3. Resection of malignant retroperitoneal mass. 4. Resection of Meckel's diverticulum. 5. On-Q pain pump catheter.  SURGEON:  [name]. Andrey Campanile, MD, FACS  ASSISTANT SURGEON:  Almond Lint, MD, FACS.  ANESTHESIA:  General plus local consisting of 0.25% Marcaine with epinephrine.  EBL:  50 mL.  DRAINS:  Nasogastric tube, Foley catheter, On-Q pain catheters.  SPECIMEN: 1. Retroperitoneal malignant mass. 2. Small bowel mesentery lymph node. 3. Meckel's diverticulum.  INDICATIONS FOR PROCEDURE:  The patient is a very pleasant 62 year old gentleman who developed some microscopic hematuria in the fall.  This prompted a CT scan of his abdomen and pelvis by the urologist.  He had an incidental finding of a mass in his retroperitoneum between his aorta and his vena cava near the 2nd and 3rd portion of his duodenum.  He is referred to me for evaluation.  He was essentially asymptomatic.  I did recommend ongoing evaluation.  He ultimately underwent endoscopic ultrasound and EGD which demonstrated no communication to the bowel.  An EUS biopsy demonstrated this consistent with a neuroendocrine tumor. Based on this finding, I recommended resection of the mass.  We had a very prolonged discussion with him and his wife.  We discussed the risks and benefits of surgery including, but not limited to, bleeding, infection, injury to  surrounding structures, ileus, significant bleeding, hernia formation, blood clot formation, anesthesia risk, need for potential bowel resection.  We did talk about the possibility of it based on this anatomic location that it may require Whipple procedure. I had him seen and evaluated by Dr. Donell Beers, my partner.  After discussing the potential for possible Whipple, the patient stated that he would choose to have an exploratory laparotomy with resection of the mass and surrounding bowel, however, he did not consent to a Whipple procedure.  If we felt that it could not come out without doing a Whipple, then he would rather leave the mass and add the potential complications of a Whipple procedure.  DESCRIPTION OF PROCEDURE:  After obtaining informed consent, the patient was taken to the operating room to the [facility].  He was placed supine on the operating table.  Sequential compression devices were placed.  General endotracheal anesthesia was established. Once he was asleep, a 2nd IV and A-line were placed.  His abdomen was prepped and draped in the usual standard surgical fashion.  A surgical time-out was performed.  He received antibiotics prior to skin incision.  Local was infiltrated at the base of the umbilicus.  Next, a vertical 1 inch incision was made with a 11 blade.  The fascia was grasped and lifted anteriorly.  The fascia was incised with #11 blade and the  abdominal cavity was entered.  A pursestring suture was placed around the fascial edges with 0 Vicryl.  A 12 mm Hasson trocar was placed. Pneumoperitoneum was smoothly established up to a pressure of 15 mmHg. Laparoscope was advanced.  The abdominal cavity was surveilled.  There was no gross evidence of masses within the liver or stomach or abdominal wall.  Two additional 5-mm trocars were placed in the right lateral abdomen under direct visualization.  At this time, Dr. Donell Beers performed laparoscopic  liver ultrasound.  Please see her operative note for further details.  Once the ultrasound was performed, we converted to an open procedure.  Midline incision was made from the subxiphoid down to just below the umbilicus.  The abdominal cavity was entered.  A Bookwalter retractor was placed.  We were able to palpate the mass in the midabdomen at the base of the small bowel mesentery.  We spent some time determining the best way to approach it.  It did not appear that kocherizing the duodenum would be of much benefit.  Therefore, we started mobilizing the right colon.  The white line of Toldt was taken down with electrocautery as well as finger dissection.  We then came up the right pericolic gutter in a serial fashion.  The hepatic flexure was taken down with Harmonic scalpel.  The C loop of the duodenum was visualized.  At this point, we were able to get a better feel of the mass and look at the mass.  We took down some of the peritoneum overlying the pelvic inlet in order to reflect the appendix and terminal ileum and cecum up.  We were able to identify the mass which was anterior to Gerota's fascia.  We started tediously mobilizing the mass. It had some friable tissue in the area.  We used a combination of surgical clips, Harmonic scalpel, and Bovie electrocautery to isolate and mobilize the mass.  It was anterior to the vena cava and we were able to dissect it up off the vena cava.  We essentially dissected it up inferiorly and away towards the cephalad portion of the mass.  It was just inferior to the 2nd portion of the duodenum, however, there was a plane of dissection there.  It was isolated and separated with Bovie electrocautery.  We were eventually able to free the mass in its entirety.  There were 1 or 2 areas of oozing that were ligated with 2-0 silk suture.  The mass was passed off the field.  In examining the small bowel, we did appreciate some enlarged lymph nodes along the  SMA vascular pedicle as well as 1 more lateral near the mesenteric border of proximal small bowel.  We isolated one of these lymph nodes and mobilized it with electrocautery as well as the Harmonic scalpel.  None of the mesenteric vasculature was compromised for this section of small bowel.  The lymph node was sent as a separate specimen.  At this point, we ran the small bowel starting at the terminal ileum all the way up to the ligament of Treitz.  When we ran the small bowel, we came across a small Meckel's diverticulum.  We were able to staple it off with a GIA stapler without compromising the lumen of the small bowel.  We ran the small bowel again.  There was no evidence of enterotomy.  The terminal ileum, cecum, and right colon appeared normal and nonischemic.  In mobilizing the cecum in the right colon up off the lesion,  we did create 2 rents in the terminal ileum and right colon mesentery.  However, the ileocolonic vascular pedicle was intact.  There is 1 large defect that we elected to leave open, however, there was a smaller defect that we closed with interrupted 2-0 silk sutures.  We irrigated the abdomen, where the tumor was removed.  We placed several pieces of fibular.  Two On-Q pain catheters were tunneled along the edge of the incision in the preperitoneal space.  We then closed the abdomen with #1 looped PDS, 1 from above and 1 from below.  The skin was irrigated and the skin was closed with surgical staples along with the 2 trocar sites.  At this point, we threaded the On-Q pain catheters and removed the tube sheath.  Sterile dressing was applied.  All needle, instrument, and sponge counts were correct x2.  There were no immediate complications. The patient tolerated the procedure well.  He was taken to the recovery room in stable condition.     [name]. Andrey Campanile, MD, FACS     EMW/MEDQ  D:  01/10/2013  T:  01/11/2013  Job:  [id number]  cc:   Willis Modena, MD [name], M.D.

## 2013-01-12 LAB — BASIC METABOLIC PANEL
BUN: 11 mg/dL (ref 6–23)
Creatinine, Ser: 1.2 mg/dL (ref 0.50–1.35)
GFR calc Af Amer: 73 mL/min — ABNORMAL LOW (ref >90)
GFR calc non Af Amer: 63 mL/min — ABNORMAL LOW (ref >90)
Potassium: 4.2 mEq/L (ref 3.5–5.1)

## 2013-01-12 LAB — CBC
HCT: 33.6 % — ABNORMAL LOW (ref 39.0–52.0)
MCHC: 33.6 g/dL (ref 30.0–36.0)
Platelets: 166 10*3/uL (ref 150–400)
RDW: 13.3 % (ref 11.5–15.5)

## 2013-01-12 NOTE — Progress Notes (Signed)
2 Days Post-Op  Subjective: WALKING HALL.  No nausea.    Objective: Vital signs in last 24 hours: Temp:  [98 F (36.7 C)-101.8 F (38.8 C)] 99.2 F (37.3 C) (06/14 0518) Pulse Rate:  [73-93] 73 (06/14 0518) Resp:  [13-20] 18 (06/14 0518) BP: (128-149)/(76-89) 149/89 mmHg (06/14 0518) SpO2:  [92 %-98 %] 96 % (06/14 0518) Last BM Date: 01/10/13  Intake/Output from previous day: 06/13 0701 - 06/14 0700 In: 3183 [I.V.:3183] Out: 200 [Emesis/NG output:200] Intake/Output this shift: Total I/O In: -  Out: 300 [Urine:300]  Incision/Wound:clean.  No redness.  Soft ND  occ BS  Lab Results:   Recent Labs  01/11/13 0550 01/12/13 0610  WBC 9.2 9.8  HGB 12.5* 11.3*  HCT 37.4* 33.6*  PLT 213 166   BMET  Recent Labs  01/11/13 0550 01/12/13 0610  NA 134* 132*  K 4.6 4.2  CL 99 97  CO2 30 29  GLUCOSE 118* 124*  BUN 19 11  CREATININE 1.31 1.20  CALCIUM 8.4 8.4   PT/INR No results found for this basename: LABPROT, INR,  in the last 72 hours ABG No results found for this basename: PHART, PCO2, PO2, HCO3,  in the last 72 hours  Studies/Results: No results found.  Anti-infectives: Anti-infectives   Start     Dose/Rate Route Frequency Ordered Stop   01/10/13 1100  metroNIDAZOLE (FLAGYL) IVPB 500 mg     500 mg 100 mL/hr over 60 Minutes Intravenous  Once 01/10/13 1050 01/10/13 1344   01/10/13 0600  ceFAZolin (ANCEF) IVPB 2 g/50 mL premix     2 g 100 mL/hr over 30 Minutes Intravenous On call to O.R. 01/09/13 1420 01/10/13 0740      Assessment/Plan: s/p Procedure(s): LAPAROSCOPY DIAGNOSTIC (N/A) RESECTION OF MALIGNANT RETROPERITONEAL MASS (N/A) LAPAROSCOPIC LIVER ULTRASOUND (N/A) Remove NGT Ice chips only Ambulate IS  LOS: 2 days    [name] A. 01/12/2013

## 2013-01-13 MED ORDER — IRBESARTAN 150 MG PO TABS
150.0000 mg | ORAL_TABLET | Freq: Every day | ORAL | Status: DC
  Administered 2013-01-13 – 2013-01-20 (×7): 150 mg via ORAL
  Filled 2013-01-13 (×10): qty 1

## 2013-01-13 MED ORDER — HYDROCHLOROTHIAZIDE 12.5 MG PO CAPS
12.5000 mg | ORAL_CAPSULE | Freq: Every day | ORAL | Status: DC
  Administered 2013-01-13 – 2013-01-20 (×8): 12.5 mg via ORAL
  Filled 2013-01-13 (×10): qty 1

## 2013-01-13 MED ORDER — OXYCODONE-ACETAMINOPHEN 5-325 MG PO TABS
1.0000 | ORAL_TABLET | ORAL | Status: DC | PRN
  Administered 2013-01-13 (×3): 2 via ORAL
  Filled 2013-01-13 (×4): qty 2

## 2013-01-13 MED ORDER — HYDROMORPHONE HCL PF 1 MG/ML IJ SOLN
1.0000 mg | INTRAMUSCULAR | Status: DC | PRN
  Administered 2013-01-13 – 2013-01-14 (×2): 1 mg via INTRAVENOUS
  Filled 2013-01-13 (×3): qty 1

## 2013-01-13 NOTE — Progress Notes (Signed)
3 Days Post-Op  Subjective: NGT out no nausea or vomiting.  No flatus walking.  Objective: Vital signs in last 24 hours: Temp:  [97.4 F (36.3 C)-100 F (37.8 C)] 97.5 F (36.4 C) (06/15 0545) Pulse Rate:  [70-81] 72 (06/15 0545) Resp:  [14-26] 18 (06/15 0800) BP: (138-147)/(89-94) 138/90 mmHg (06/15 0545) SpO2:  [94 %-100 %] 98 % (06/15 0828) Last BM Date: 01/10/13  Intake/Output from previous day: 06/14 0701 - 06/15 0700 In: -  Out: 1500 [Urine:1500] Intake/Output this shift:    Incision/Wound:clean dry intact sore no distension  Lab Results:   Recent Labs  01/11/13 0550 01/12/13 0610  WBC 9.2 9.8  HGB 12.5* 11.3*  HCT 37.4* 33.6*  PLT 213 166   BMET  Recent Labs  01/11/13 0550 01/12/13 0610  NA 134* 132*  K 4.6 4.2  CL 99 97  CO2 30 29  GLUCOSE 118* 124*  BUN 19 11  CREATININE 1.31 1.20  CALCIUM 8.4 8.4   PT/INR No results found for this basename: LABPROT, INR,  in the last 72 hours ABG No results found for this basename: PHART, PCO2, PO2, HCO3,  in the last 72 hours  Studies/Results: No results found.  Anti-infectives: Anti-infectives   Start     Dose/Rate Route Frequency Ordered Stop   01/10/13 1100  metroNIDAZOLE (FLAGYL) IVPB 500 mg     500 mg 100 mL/hr over 60 Minutes Intravenous  Once 01/10/13 1050 01/10/13 1344   01/10/13 0600  ceFAZolin (ANCEF) IVPB 2 g/50 mL premix     2 g 100 mL/hr over 30 Minutes Intravenous On call to O.R. 01/09/13 1420 01/10/13 0740      Assessment/Plan: s/p Procedure(s): LAPAROSCOPY DIAGNOSTIC (N/A) RESECTION OF MALIGNANT RETROPERITONEAL MASS (N/A) LAPAROSCOPIC LIVER ULTRASOUND (N/A) D/C PCA Clears D/C on Q ambulate  LOS: 3 days    [name] A. 01/13/2013

## 2013-01-14 MED ORDER — HYDROMORPHONE HCL PF 1 MG/ML IJ SOLN
1.0000 mg | INTRAMUSCULAR | Status: DC | PRN
  Administered 2013-01-14 – 2013-01-19 (×11): 1 mg via INTRAVENOUS
  Filled 2013-01-14 (×11): qty 1

## 2013-01-14 MED ORDER — KETOROLAC TROMETHAMINE 30 MG/ML IJ SOLN
30.0000 mg | Freq: Four times a day (QID) | INTRAMUSCULAR | Status: AC
  Administered 2013-01-14 – 2013-01-16 (×8): 30 mg via INTRAVENOUS
  Filled 2013-01-14 (×12): qty 1

## 2013-01-14 MED ORDER — LORAZEPAM 2 MG/ML IJ SOLN
1.0000 mg | Freq: Three times a day (TID) | INTRAMUSCULAR | Status: DC | PRN
  Administered 2013-01-14 – 2013-01-16 (×6): 1 mg via INTRAVENOUS
  Filled 2013-01-14 (×6): qty 1

## 2013-01-14 MED ORDER — BISACODYL 10 MG RE SUPP
10.0000 mg | Freq: Once | RECTAL | Status: AC
  Administered 2013-01-14: 10 mg via RECTAL
  Filled 2013-01-14: qty 1

## 2013-01-14 NOTE — Progress Notes (Signed)
4 Days Post-Op  Subjective: No flatus. +burping. Muscle spasms last night. Ambulating. Got nauseous with percocet  Objective: Vital signs in last 24 hours: Temp:  [97.3 F (36.3 C)-99 F (37.2 C)] 98.7 F (37.1 C) (06/16 0559) Pulse Rate:  [71-85] 85 (06/16 0559) Resp:  [12-18] 18 (06/16 0559) BP: (136-158)/(86-97) 136/86 mmHg (06/16 0559) SpO2:  [93 %-98 %] 94 % (06/16 0559) Last BM Date: 01/10/13  Intake/Output from previous day: 06/15 0701 - 06/16 0700 In: 1931 [P.O.:600; I.V.:1331] Out: -  Intake/Output this shift:    Alert, nad Cta b/l Reg Soft, perhaps mild distensions. Min TTP. Hypobs. Incision c/d/i  Lab Results:   Recent Labs  01/12/13 0610  WBC 9.8  HGB 11.3*  HCT 33.6*  PLT 166   BMET  Recent Labs  01/12/13 0610  NA 132*  K 4.2  CL 97  CO2 29  GLUCOSE 124*  BUN 11  CREATININE 1.20  CALCIUM 8.4   PT/INR No results found for this basename: LABPROT, INR,  in the last 72 hours ABG No results found for this basename: PHART, PCO2, PO2, HCO3,  in the last 72 hours  Studies/Results: No results found.  Anti-infectives: Anti-infectives   Start     Dose/Rate Route Frequency Ordered Stop   01/10/13 1100  metroNIDAZOLE (FLAGYL) IVPB 500 mg     500 mg 100 mL/hr over 60 Minutes Intravenous  Once 01/10/13 1050 01/10/13 1344   01/10/13 0600  ceFAZolin (ANCEF) IVPB 2 g/50 mL premix     2 g 100 mL/hr over 30 Minutes Intravenous On call to O.R. 01/09/13 1420 01/10/13 0740      Assessment/Plan: s/p Procedure(s): LAPAROSCOPY DIAGNOSTIC (N/A) RESECTION OF MALIGNANT RETROPERITONEAL MASS (N/A) LAPAROSCOPIC LIVER ULTRASOUND (N/A) Resection of meckel's  Keep on clears Suppository Add toradol Ativan prn for spasms Ambulate, can have off floor privileges  [name]. Andrey Campanile, MD, FACS General, Bariatric, & Minimally Invasive Surgery [facility], [place]   LOS: 4 days    [name] 01/14/2013

## 2013-01-15 ENCOUNTER — Encounter (HOSPITAL_COMMUNITY): Payer: Self-pay | Admitting: General Surgery

## 2013-01-15 MED ORDER — PROMETHAZINE HCL 25 MG/ML IJ SOLN
12.5000 mg | INTRAMUSCULAR | Status: DC | PRN
  Administered 2013-01-18: 12.5 mg via INTRAVENOUS
  Filled 2013-01-15 (×3): qty 1

## 2013-01-15 MED ORDER — HYDROMORPHONE HCL 2 MG PO TABS
2.0000 mg | ORAL_TABLET | ORAL | Status: DC | PRN
  Administered 2013-01-15 – 2013-01-19 (×3): 2 mg via ORAL
  Filled 2013-01-15 (×4): qty 1

## 2013-01-15 NOTE — Progress Notes (Signed)
Discussed path report with pt and wife and gave a copy of report along with up to date handout on pheo  Pt still nauseous and having back spasms. Had several BMs today  Will keep diet on fulls since still fairly nauseous Ambulate Ativan prn for spasms Recheck labs in AM  [place]. Andrey Campanile, MD, FACS General, Bariatric, & Minimally Invasive Surgery [facility], [place]

## 2013-01-15 NOTE — Progress Notes (Signed)
5 Days Post-Op   Assessment: s/p Procedure(s): LAPAROSCOPY DIAGNOSTIC RESECTION OF MALIGNANT RETROPERITONEAL MASS LAPAROSCOPIC LIVER ULTRASOUND Patient Active Problem List   Diagnosis Date Noted  . Retroperitoneal Neuroendocrine tumor 11/19/2012    Seems to be making progress  Plan: Advance diet Try on PO dilaudid  Subjective: Feels better, not taking much PO as doesn't like clears. No BM, but passed gas. Not using much pain meds, wants to try oral again.  Objective: Vital signs in last 24 hours: Temp:  [98.1 F (36.7 C)-98.8 F (37.1 C)] 98.8 F (37.1 C) (06/17 0539) Pulse Rate:  [71-79] 71 (06/17 0539) Resp:  [16-19] 18 (06/17 0539) BP: (131-145)/(93-101) 134/101 mmHg (06/17 0539) SpO2:  [95 %-96 %] 95 % (06/17 0539)   Intake/Output from previous day:    General appearance: alert, cooperative and no distress Resp: clear to auscultation bilaterally Cardio: regular rate and rhythm, S1, S2 normal, no murmur, click, rub or gallop GI: Minimlally distended, soft, slightly tender at incision, a few BS  Incision: healing well  Lab Results:  No results found for this basename: WBC, HGB, HCT, PLT,  in the last 72 hours BMET No results found for this basename: NA, K, CL, CO2, GLUCOSE, BUN, CREATININE, CALCIUM,  in the last 72 hours  MEDS, Scheduled . enoxaparin (LOVENOX) injection  40 mg Subcutaneous Q24H  . hydrochlorothiazide  12.5 mg Oral Daily  . irbesartan  150 mg Oral Daily  . ketorolac  30 mg Intravenous Q6H  . pantoprazole (PROTONIX) IV  40 mg Intravenous Q24H    Studies/Results: No results found.    LOS: 5 days     Currie Paris, MD, [facility] Surgery, [place] [phone]   01/15/2013 9:15 AM

## 2013-01-15 NOTE — Progress Notes (Signed)
Pt continues to c/o of nausea and stated he threw up in the bathroom despite IV Zofran 4mg  given but no emesis noted because he flushed it,advised him to let me know if he throws up again,Md on call notified and new order received.

## 2013-01-16 ENCOUNTER — Inpatient Hospital Stay (HOSPITAL_COMMUNITY): Payer: PRIVATE HEALTH INSURANCE

## 2013-01-16 LAB — BASIC METABOLIC PANEL
Calcium: 9.3 mg/dL (ref 8.4–10.5)
Chloride: 95 mEq/L — ABNORMAL LOW (ref 96–112)
Creatinine, Ser: 1.43 mg/dL — ABNORMAL HIGH (ref 0.50–1.35)
GFR calc Af Amer: 59 mL/min — ABNORMAL LOW (ref >90)

## 2013-01-16 LAB — CBC WITH DIFFERENTIAL/PLATELET
Basophils Relative: 0 % (ref 0–1)
Eosinophils Absolute: 0.1 10*3/uL (ref 0.0–0.7)
Eosinophils Relative: 1 % (ref 0–5)
Lymphs Abs: 1.3 10*3/uL (ref 0.7–4.0)
Monocytes Absolute: 0.9 10*3/uL (ref 0.1–1.0)
Monocytes Relative: 8 % (ref 3–12)
Platelets: 261 10*3/uL (ref 150–400)
RDW: 12.6 % (ref 11.5–15.5)
WBC: 11.6 10*3/uL — ABNORMAL HIGH (ref 4.0–10.5)

## 2013-01-16 MED ORDER — SODIUM CHLORIDE 0.9 % IV BOLUS (SEPSIS)
1000.0000 mL | Freq: Once | INTRAVENOUS | Status: AC
  Administered 2013-01-16: 1000 mL via INTRAVENOUS

## 2013-01-16 MED ORDER — PROMETHAZINE HCL 25 MG/ML IJ SOLN: 12.5000 mg | Freq: Four times a day (QID) | INTRAMUSCULAR | Status: DC | PRN

## 2013-01-16 MED ORDER — METOCLOPRAMIDE HCL 5 MG/ML IJ SOLN
10.0000 mg | Freq: Three times a day (TID) | INTRAMUSCULAR | Status: AC
  Administered 2013-01-16 – 2013-01-19 (×10): 10 mg via INTRAVENOUS
  Filled 2013-01-16 (×16): qty 2

## 2013-01-16 MED ORDER — ONDANSETRON HCL 4 MG/2ML IJ SOLN
4.0000 mg | Freq: Four times a day (QID) | INTRAMUSCULAR | Status: DC
  Administered 2013-01-16 – 2013-01-19 (×12): 4 mg via INTRAVENOUS
  Filled 2013-01-16 (×12): qty 2

## 2013-01-16 MED ORDER — CYCLOBENZAPRINE HCL 10 MG PO TABS
10.0000 mg | ORAL_TABLET | Freq: Three times a day (TID) | ORAL | Status: DC
  Administered 2013-01-16 – 2013-01-20 (×6): 10 mg via ORAL
  Filled 2013-01-16 (×16): qty 1

## 2013-01-16 NOTE — Progress Notes (Signed)
6 Days Post-Op  Subjective: Had multiple BMs yesterday but vomited once or twice; still fairly nauseous. Burping and belching. Still having muscle spasms  Objective: Vital signs in last 24 hours: Temp:  [98.1 F (36.7 C)-98.4 F (36.9 C)] 98.1 F (36.7 C) (06/18 0539) Pulse Rate:  [87-104] 93 (06/18 0539) Resp:  [20] 20 (06/18 0539) BP: (139-151)/(91-104) 139/103 mmHg (06/18 0539) SpO2:  [92 %-96 %] 92 % (06/18 0539) Last BM Date: 01/15/13  Intake/Output from previous day: 06/17 0701 - 06/18 0700 In: 1653.8 [P.O.:140; I.V.:1513.8] Out: -  Intake/Output this shift:    Non-toxic cta b/l Reg Soft, ?mild distension. NT. Incision c/d/i. hypoBS No edema  Lab Results:   Recent Labs  01/16/13 0542  WBC 11.6*  HGB 13.1  HCT 36.8*  PLT 261   BMET  Recent Labs  01/16/13 0542  NA 130*  K 3.9  CL 95*  CO2 25  GLUCOSE 117*  BUN 15  CREATININE 1.43*  CALCIUM 9.3   PT/INR No results found for this basename: LABPROT, INR,  in the last 72 hours ABG No results found for this basename: PHART, PCO2, PO2, HCO3,  in the last 72 hours  Studies/Results: No results found.  Anti-infectives: Anti-infectives   Start     Dose/Rate Route Frequency Ordered Stop   01/10/13 1100  metroNIDAZOLE (FLAGYL) IVPB 500 mg     500 mg 100 mL/hr over 60 Minutes Intravenous  Once 01/10/13 1050 01/10/13 1344   01/10/13 0600  ceFAZolin (ANCEF) IVPB 2 g/50 mL premix     2 g 100 mL/hr over 30 Minutes Intravenous On call to O.R. 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 He states he gets very nauseous, goes to bathroom, and has a BM. Pt burping while i'm in room Will check abd series; add reglan  Increased Cr - ?prerenal vs toradol; fluid  bolus. toradol complete. Repeat Cr in am.  Muscle spasms - cont ativan, add flexeril  i believe small bump in wbc is likely hemoconcentration since hgb/hct went up as well. No fever. Incision ok.   HTN - cont home bp meds. If diastolic is persistently high today will add another agent  [name]. Andrey Campanile, MD, FACS General, Bariatric, & Minimally Invasive Surgery [facility], [place]     LOS: 6 days    [name] 01/16/2013

## 2013-01-17 LAB — BASIC METABOLIC PANEL
BUN: 13 mg/dL (ref 6–23)
Creatinine, Ser: 1.39 mg/dL — ABNORMAL HIGH (ref 0.50–1.35)
GFR calc Af Amer: 61 mL/min — ABNORMAL LOW (ref >90)
GFR calc non Af Amer: 52 mL/min — ABNORMAL LOW (ref >90)
Glucose, Bld: 105 mg/dL — ABNORMAL HIGH (ref 70–99)
Potassium: 3.8 mEq/L (ref 3.5–5.1)

## 2013-01-17 LAB — CBC WITH DIFFERENTIAL/PLATELET
Basophils Absolute: 0 10*3/uL (ref 0.0–0.1)
Basophils Relative: 0 % (ref 0–1)
Eosinophils Absolute: 0.2 10*3/uL (ref 0.0–0.7)
Hemoglobin: 11.1 g/dL — ABNORMAL LOW (ref 13.0–17.0)
MCH: 29.5 pg (ref 26.0–34.0)
MCHC: 34.8 g/dL (ref 30.0–36.0)
Monocytes Absolute: 0.9 10*3/uL (ref 0.1–1.0)
Monocytes Relative: 11 % (ref 3–12)
Neutro Abs: 5.1 10*3/uL (ref 1.7–7.7)
Neutrophils Relative %: 63 % (ref 43–77)
RDW: 12.6 % (ref 11.5–15.5)

## 2013-01-17 MED ORDER — BISACODYL 10 MG RE SUPP
10.0000 mg | Freq: Once | RECTAL | Status: AC
  Administered 2013-01-17: 10 mg via RECTAL
  Filled 2013-01-17: qty 1

## 2013-01-17 NOTE — Progress Notes (Signed)
Reports he has a good day. Min nausea. Still burping occasionally. No flatus. Feels better walked outside. Less pain. No pain meds today  Looks better Seems more upbeat  If no significant flatus overnight, plan ct in am  [name]. Andrey Campanile, MD, FACS General, Bariatric, & Minimally Invasive Surgery [facility], [place]

## 2013-01-17 NOTE — Progress Notes (Signed)
7 Days Post-Op  Subjective: 1 large episode of emesis late pm. None since then. Slept well. No abd pain. Some burping and flatus  Objective: Vital signs in last 24 hours: Temp:  [98.5 F (36.9 C)-99.9 F (37.7 C)] 98.5 F (36.9 C) (06/19 0555) Pulse Rate:  [70-88] 70 (06/19 0555) Resp:  [18-20] 18 (06/19 0555) BP: (128-155)/(84-98) 128/84 mmHg (06/19 0555) SpO2:  [94 %-99 %] 94 % (06/19 0555) Last BM Date: 01/16/13  Intake/Output from previous day: 06/18 0701 - 06/19 0700 In: 1700 [I.V.:1700] Out: -  Intake/Output this shift:    nad cta b/l Reg Soft, nt, incision c/d/i. HypoBS. Very mild distension  Lab Results:   Recent Labs  01/16/13 0542 01/17/13 0620  WBC 11.6* 8.2  HGB 13.1 11.1*  HCT 36.8* 31.9*  PLT 261 225   BMET  Recent Labs  01/16/13 0542 01/17/13 0620  NA 130* 135  K 3.9 3.8  CL 95* 101  CO2 25 25  GLUCOSE 117* 105*  BUN 15 13  CREATININE 1.43* 1.39*  CALCIUM 9.3 8.7   PT/INR No results found for this basename: LABPROT, INR,  in the last 72 hours ABG No results found for this basename: PHART, PCO2, PO2, HCO3,  in the last 72 hours  Studies/Results: Dg Abd Acute W/chest  01/16/2013   *RADIOLOGY REPORT*  Clinical Data: Postop day 6 laparotomy.  Nausea vomiting  ACUTE ABDOMEN SERIES (ABDOMEN 2 VIEW & CHEST 1 VIEW)  Comparison: 01/03/2013  Findings: Right lower lobe airspace disease has developed since the prior study and  may represent atelectasis or pneumonia.  No heart failure.  Minimal left effusion.  Dilated small bowel loops in the left abdomen.  Colon is decompressed.  There is no air in the rectum.  There is no free air.  Midline skin staples are noted.  IMPRESSION: Interval development of right lower lobe atelectasis or infiltrate.  Dilated small bowel may represent postop ileus or small bowel obstruction.  Continued follow-up is suggested.   Original Report Authenticated By: Janeece Riggers, M.D.    Anti-infectives: Anti-infectives   Start     Dose/Rate Route Frequency Ordered Stop   01/10/13 1100  metroNIDAZOLE (FLAGYL) IVPB 500 mg     500 mg 100 mL/hr over 60 Minutes Intravenous  Once 01/10/13 1050 01/10/13 1344   01/10/13 0600  ceFAZolin (ANCEF) IVPB 2 g/50 mL premix     2 g 100 mL/hr over 30 Minutes Intravenous On call to O.R. 01/09/13 1420 01/10/13 0740      Assessment/Plan: s/p Procedure(s): LAPAROSCOPY DIAGNOSTIC (N/A) RESECTION OF MALIGNANT RETROPERITONEAL MASS (N/A) LAPAROSCOPIC LIVER ULTRASOUND (N/A) Resection of meckels  Oob, is, flutter valve Postop ileus - lytes ok, cont reglan. Clears. If doesn't make signif progress today, plan CT on Friday. Will consider Picc/tpn as well.  Increased cr - down slightly today. Cont IVF. Repeat in am HTN - better yesterday. Cont home meds  [name]. Andrey Campanile, MD, FACS General, Bariatric, & Minimally Invasive Surgery [facility], [place]   LOS: 7 days    [name] 01/17/2013

## 2013-01-18 ENCOUNTER — Inpatient Hospital Stay (HOSPITAL_COMMUNITY): Payer: PRIVATE HEALTH INSURANCE

## 2013-01-18 LAB — COMPREHENSIVE METABOLIC PANEL
BUN: 9 mg/dL (ref 6–23)
Calcium: 8.9 mg/dL (ref 8.4–10.5)
Creatinine, Ser: 1.28 mg/dL (ref 0.50–1.35)
GFR calc Af Amer: 67 mL/min — ABNORMAL LOW (ref >90)
Glucose, Bld: 103 mg/dL — ABNORMAL HIGH (ref 70–99)
Sodium: 131 mEq/L — ABNORMAL LOW (ref 135–145)
Total Protein: 5.7 g/dL — ABNORMAL LOW (ref 6.0–8.3)

## 2013-01-18 MED ORDER — IOHEXOL 300 MG/ML  SOLN
25.0000 mL | INTRAMUSCULAR | Status: AC
  Administered 2013-01-18 (×2): 25 mL via ORAL

## 2013-01-18 MED ORDER — POLYETHYLENE GLYCOL 3350 17 G PO PACK
17.0000 g | PACK | Freq: Once | ORAL | Status: DC
  Filled 2013-01-18: qty 1

## 2013-01-18 MED ORDER — IOHEXOL 300 MG/ML  SOLN
100.0000 mL | Freq: Once | INTRAMUSCULAR | Status: AC | PRN
  Administered 2013-01-18: 100 mL via INTRAVENOUS

## 2013-01-18 MED ORDER — ZOLPIDEM TARTRATE 5 MG PO TABS: 5.0000 mg | ORAL_TABLET | Freq: Every evening | ORAL | Status: DC | PRN

## 2013-01-18 MED ORDER — ENSURE COMPLETE PO LIQD
237.0000 mL | Freq: Two times a day (BID) | ORAL | Status: DC
  Administered 2013-01-18: 237 mL via ORAL

## 2013-01-18 MED ORDER — PANTOPRAZOLE SODIUM 40 MG PO TBEC
40.0000 mg | DELAYED_RELEASE_TABLET | Freq: Every day | ORAL | Status: DC
  Administered 2013-01-18 – 2013-01-19 (×2): 40 mg via ORAL
  Filled 2013-01-18 (×3): qty 1

## 2013-01-18 NOTE — Progress Notes (Signed)
8 Days Post-Op  Subjective: No n/v. Some flatus. Feels a lot better. Didn't sleep well - " i think that means i'm doing better"; concerned about some lateral rt thigh swelling. No calf tenderness  Objective: Vital signs in last 24 hours: Temp:  [98.3 F (36.8 C)-99 F (37.2 C)] 98.3 F (36.8 C) (06/20 0508) Pulse Rate:  [69-82] 72 (06/20 0508) Resp:  [16-20] 16 (06/20 0508) BP: (130-147)/(88-101) 139/91 mmHg (06/20 0508) SpO2:  [97 %-99 %] 97 % (06/20 0508) Last BM Date: 01/17/13 (per pt 3 times, small amounts)  Intake/Output from previous day: 06/19 0701 - 06/20 0700 In: 2546.7 [P.O.:840; I.V.:1706.7] Out: -  Intake/Output this shift:    Alert, nad, smiling cta b/l Reg Soft, bruising, incision c/d/i. HypoBS. nd i appreciate any swelling or edema to thigh Lab Results:   Recent Labs  01/16/13 0542 01/17/13 0620  WBC 11.6* 8.2  HGB 13.1 11.1*  HCT 36.8* 31.9*  PLT 261 225   BMET  Recent Labs  01/17/13 0620 01/18/13 0500  NA 135 131*  K 3.8 3.7  CL 101 98  CO2 25 25  GLUCOSE 105* 103*  BUN 13 9  CREATININE 1.39* 1.28  CALCIUM 8.7 8.9   PT/INR No results found for this basename: LABPROT, INR,  in the last 72 hours ABG No results found for this basename: PHART, PCO2, PO2, HCO3,  in the last 72 hours  Studies/Results: Dg Abd Acute W/chest  01/16/2013   *RADIOLOGY REPORT*  Clinical Data: Postop day 6 laparotomy.  Nausea vomiting  ACUTE ABDOMEN SERIES (ABDOMEN 2 VIEW & CHEST 1 VIEW)  Comparison: 01/03/2013  Findings: Right lower lobe airspace disease has developed since the prior study and  may represent atelectasis or pneumonia.  No heart failure.  Minimal left effusion.  Dilated small bowel loops in the left abdomen.  Colon is decompressed.  There is no air in the rectum.  There is no free air.  Midline skin staples are noted.  IMPRESSION: Interval development of right lower lobe atelectasis or infiltrate.  Dilated small bowel may represent postop ileus or small  bowel obstruction.  Continued follow-up is suggested.   Original Report Authenticated By: Janeece Riggers, M.D.    Anti-infectives: Anti-infectives   Start     Dose/Rate Route Frequency Ordered Stop   01/10/13 1100  metroNIDAZOLE (FLAGYL) IVPB 500 mg     500 mg 100 mL/hr over 60 Minutes Intravenous  Once 01/10/13 1050 01/10/13 1344   01/10/13 0600  ceFAZolin (ANCEF) IVPB 2 g/50 mL premix     2 g 100 mL/hr over 30 Minutes Intravenous On call to O.R. 01/09/13 1420 01/10/13 0740      Assessment/Plan: s/p Procedure(s): LAPAROSCOPY DIAGNOSTIC (N/A) RESECTION OF MALIGNANT RETROPERITONEAL MASS (N/A) LAPAROSCOPIC LIVER ULTRASOUND (N/A) Resection of meckels  Looks good and feeling better Adv diet to fulls, add ensure If fails advanced diet, will get ct ambien for sleep Monitor thigh  [name]. Nathan Campanile, MD, FACS General, Bariatric, & Minimally Invasive Surgery [facility], [place]   LOS: 8 days    [name] 01/18/2013

## 2013-01-18 NOTE — Progress Notes (Signed)
Very nauseous throughout day. Having BMs. Feels bloated.  abd distended.   Will plan CT tonight discussed with pt and wife  [name]. Andrey Campanile, MD, FACS General, Bariatric, & Minimally Invasive Surgery [facility], [place]

## 2013-01-19 LAB — CBC WITH DIFFERENTIAL/PLATELET
Eosinophils Relative: 1 % (ref 0–5)
HCT: 35.1 % — ABNORMAL LOW (ref 39.0–52.0)
Lymphocytes Relative: 15 % (ref 12–46)
Lymphs Abs: 1.4 10*3/uL (ref 0.7–4.0)
MCV: 85 fL (ref 78.0–100.0)
Monocytes Absolute: 1.3 10*3/uL — ABNORMAL HIGH (ref 0.1–1.0)
Neutro Abs: 7.1 10*3/uL (ref 1.7–7.7)
RBC: 4.13 MIL/uL — ABNORMAL LOW (ref 4.22–5.81)
WBC: 9.9 10*3/uL (ref 4.0–10.5)

## 2013-01-19 LAB — BASIC METABOLIC PANEL
CO2: 24 mEq/L (ref 19–32)
Chloride: 93 mEq/L — ABNORMAL LOW (ref 96–112)
Potassium: 3.7 mEq/L (ref 3.5–5.1)
Sodium: 129 mEq/L — ABNORMAL LOW (ref 135–145)

## 2013-01-19 LAB — MAGNESIUM: Magnesium: 1.9 mg/dL (ref 1.5–2.5)

## 2013-01-19 MED ORDER — POTASSIUM CHLORIDE IN NACL 20-0.9 MEQ/L-% IV SOLN
INTRAVENOUS | Status: DC
  Administered 2013-01-19: 13:00:00 via INTRAVENOUS
  Filled 2013-01-19 (×5): qty 1000

## 2013-01-19 MED ORDER — FUROSEMIDE 10 MG/ML IJ SOLN
20.0000 mg | Freq: Once | INTRAMUSCULAR | Status: AC
  Administered 2013-01-19: 20 mg via INTRAVENOUS
  Filled 2013-01-19: qty 2

## 2013-01-19 NOTE — Progress Notes (Signed)
9 Days Post-Op  Subjective: Had ct last night. No fever. BP better. i feel really good. +flatus and burping. Denies n/v  Objective: Vital signs in last 24 hours: Temp:  [98 F (36.7 C)-98.7 F (37.1 C)] 98 F (36.7 C) (06/21 0534) Pulse Rate:  [81-92] 81 (06/21 0534) Resp:  [15-18] 18 (06/21 0534) BP: (133-158)/(87-103) 133/87 mmHg (06/21 0534) SpO2:  [95 %-98 %] 95 % (06/21 0534) Last BM Date: 01/18/13  Intake/Output from previous day: 06/20 0701 - 06/21 0700 In: 2399.5 [P.O.:610; I.V.:1789.5] Out: -  Intake/Output this shift:    Alert, sitting inchair, just got thru walking in halls cta b/l Reg Soft, mild distension, incision c/d/i; +BS No signif edema  Lab Results:   Recent Labs  01/17/13 0620 01/19/13 0445  WBC 8.2 9.9  HGB 11.1* 12.2*  HCT 31.9* 35.1*  PLT 225 288   BMET  Recent Labs  01/18/13 0500 01/19/13 0445  NA 131* 129*  K 3.7 3.7  CL 98 93*  CO2 25 24  GLUCOSE 103* 108*  BUN 9 8  CREATININE 1.28 1.23  CALCIUM 8.9 9.1   PT/INR No results found for this basename: LABPROT, INR,  in the last 72 hours ABG No results found for this basename: PHART, PCO2, PO2, HCO3,  in the last 72 hours  Studies/Results: Ct Abdomen Pelvis W Contrast  01/18/2013   *RADIOLOGY REPORT*  Clinical Data: Nausea and abdominal distention.  Recent surgery  CT ABDOMEN AND PELVIS WITH CONTRAST  Technique:  Multidetector CT imaging of the abdomen and pelvis was performed following the standard protocol during bolus administration of intravenous contrast.  Contrast: OMNIPAQUE IOHEXOL 300 MG/ML  SOLN  Comparison: CT scan 11/15/2012.  Findings: The lung bases demonstrate bibasilar atelectasis, right greater than left.  The heart is normal in size.  No pericardial effusion.  Stable mild fusiform dilatation of the ascending aorta.  The liver is unremarkable.  No focal hepatic lesions or intrahepatic biliary dilatation.  The gallbladder is mildly distended.  No inflammatory  changes.  No common bile duct dilatation.  The pancreas is normal.  The spleen is normal.  The adrenal glands and kidneys are normal.  The stomach is distended with contrast.  The duodenum is normal. The small bowel is dilated with scattered air fluid levels.  The distal and terminal ileum are normal in caliber/decompressed. Expected postoperative changes near the periaortic mass resection with a small amount of fluid and surgical clips.  There is a small amount of residual free air.  The colon is relatively decompressed. Although the findings could reflect a postoperative ileus I am somewhat suspicious there may be a very distal small bowel obstruction.  The appearance of the distal ileal loops compared to the more proximal small bowel.  An exact site for obstruction is not identified for certain.  The aorta and branch vessels are stable.  No mesenteric or retroperitoneal mass or adenopathy.  The bladder is mildly distended.  The prostate gland and seminal vesicles are unremarkable.  There is a small amount of free pelvic fluid which is likely postoperative.  The anterior abdominal wall skin staples are noted.  Mild diffuse but asymmetric subcutaneous edema.  IMPRESSION:  1.  Dilated small bowel with air-fluid levels transitioning to normal/decompressed distal and terminal ileum.  Findings suspicious for early small bowel obstruction. 2.  Expected postoperative changes with free air and free fluid. 3.  Bibasilar atelectasis.   Original Report Authenticated By: Rudie Meyer, M.D.  Anti-infectives: Anti-infectives   Start     Dose/Rate Route Frequency Ordered Stop   01/10/13 1100  metroNIDAZOLE (FLAGYL) IVPB 500 mg     500 mg 100 mL/hr over 60 Minutes Intravenous  Once 01/10/13 1050 01/10/13 1344   01/10/13 0600  ceFAZolin (ANCEF) IVPB 2 g/50 mL premix     2 g 100 mL/hr over 30 Minutes Intravenous On call to O.R. 01/09/13 1420 01/10/13 0740      Assessment/Plan: s/p Procedure(s): LAPAROSCOPY  DIAGNOSTIC (N/A) RESECTION OF MALIGNANT RETROPERITONEAL MASS (N/A) LAPAROSCOPIC LIVER ULTRASOUND (N/A) Resection of meckels  HTN - cont home meds. BP better Postop ileus -  Ct reviewed - ileus vs early psbo; Will cont to let pt have sips of clears; stressed to pt and wife the importance of not pushing it especially if feels nauseous. Cont reglan F/E/N - if doesn't make significant improvement in next day or so with respect to return of bowel function - picc/TPN. Will give some lasix for edema. Change IVF for hyponatremia Cont VTE prophylaxis  [name]. Andrey Campanile, MD, FACS General, Bariatric, & Minimally Invasive Surgery [facility] Surgery, [place]   LOS: 9 days    [name] 01/19/2013

## 2013-01-20 LAB — BASIC METABOLIC PANEL
CO2: 24 mEq/L (ref 19–32)
Chloride: 94 mEq/L — ABNORMAL LOW (ref 96–112)
Creatinine, Ser: 1.31 mg/dL (ref 0.50–1.35)
Potassium: 3.6 mEq/L (ref 3.5–5.1)

## 2013-01-20 MED ORDER — HYDROCODONE-ACETAMINOPHEN 5-325 MG PO TABS: 1.0000 | ORAL_TABLET | Freq: Four times a day (QID) | ORAL | Status: DC | PRN

## 2013-01-20 MED ORDER — CYCLOBENZAPRINE HCL 10 MG PO TABS: 10.0000 mg | ORAL_TABLET | Freq: Three times a day (TID) | ORAL | Status: DC

## 2013-01-20 NOTE — Progress Notes (Signed)
10 Days Post-Op  Subjective: Did well yesterday. Reports lots of gas, minimal burping. No nausea/vomiting. Ambulated multiple times. Tolerated clears  Objective: Vital signs in last 24 hours: Temp:  [97.6 F (36.4 C)-98.1 F (36.7 C)] 98 F (36.7 C) (06/22 0607) Pulse Rate:  [74-86] 78 (06/22 0607) Resp:  [17-18] 18 (06/22 0607) BP: (131-143)/(81-94) 131/81 mmHg (06/22 0607) SpO2:  [94 %-98 %] 94 % (06/22 0607) Last BM Date: 01/19/13  Intake/Output from previous day: 06/21 0701 - 06/22 0700 In: 2046 [P.O.:960; I.V.:1086] Out: -  Intake/Output this shift:    Alert, nad sitting in chair cta b/l Reg Soft, nd, nt, incision c/d/i with some bruising  Lab Results:   Recent Labs  01/19/13 0445  WBC 9.9  HGB 12.2*  HCT 35.1*  PLT 288   BMET  Recent Labs  01/19/13 0445 01/20/13 0525  NA 129* 127*  K 3.7 3.6  CL 93* 94*  CO2 24 24  GLUCOSE 108* 85  BUN 8 13  CREATININE 1.23 1.31  CALCIUM 9.1 8.6   PT/INR No results found for this basename: LABPROT, INR,  in the last 72 hours ABG No results found for this basename: PHART, PCO2, PO2, HCO3,  in the last 72 hours  Studies/Results: Ct Abdomen Pelvis W Contrast  01/18/2013   *RADIOLOGY REPORT*  Clinical Data: Nausea and abdominal distention.  Recent surgery  CT ABDOMEN AND PELVIS WITH CONTRAST  Technique:  Multidetector CT imaging of the abdomen and pelvis was performed following the standard protocol during bolus administration of intravenous contrast.  Contrast: OMNIPAQUE IOHEXOL 300 MG/ML  SOLN  Comparison: CT scan 11/15/2012.  Findings: The lung bases demonstrate bibasilar atelectasis, right greater than left.  The heart is normal in size.  No pericardial effusion.  Stable mild fusiform dilatation of the ascending aorta.  The liver is unremarkable.  No focal hepatic lesions or intrahepatic biliary dilatation.  The gallbladder is mildly distended.  No inflammatory changes.  No common bile duct dilatation.  The  pancreas is normal.  The spleen is normal.  The adrenal glands and kidneys are normal.  The stomach is distended with contrast.  The duodenum is normal. The small bowel is dilated with scattered air fluid levels.  The distal and terminal ileum are normal in caliber/decompressed. Expected postoperative changes near the periaortic mass resection with a small amount of fluid and surgical clips.  There is a small amount of residual free air.  The colon is relatively decompressed. Although the findings could reflect a postoperative ileus I am somewhat suspicious there may be a very distal small bowel obstruction.  The appearance of the distal ileal loops compared to the more proximal small bowel.  An exact site for obstruction is not identified for certain.  The aorta and branch vessels are stable.  No mesenteric or retroperitoneal mass or adenopathy.  The bladder is mildly distended.  The prostate gland and seminal vesicles are unremarkable.  There is a small amount of free pelvic fluid which is likely postoperative.  The anterior abdominal wall skin staples are noted.  Mild diffuse but asymmetric subcutaneous edema.  IMPRESSION:  1.  Dilated small bowel with air-fluid levels transitioning to normal/decompressed distal and terminal ileum.  Findings suspicious for early small bowel obstruction. 2.  Expected postoperative changes with free air and free fluid. 3.  Bibasilar atelectasis.   Original Report Authenticated By: Rudie Meyer, M.D.    Anti-infectives: Anti-infectives   Start     Dose/Rate Route Frequency  Ordered Stop   01/10/13 1100  metroNIDAZOLE (FLAGYL) IVPB 500 mg     500 mg 100 mL/hr over 60 Minutes Intravenous  Once 01/10/13 1050 01/10/13 1344   01/10/13 0600  ceFAZolin (ANCEF) IVPB 2 g/50 mL premix     2 g 100 mL/hr over 30 Minutes Intravenous On call to O.R. 01/09/13 1420 01/10/13 0740      Assessment/Plan: s/p Procedure(s): LAPAROSCOPY DIAGNOSTIC (N/A) RESECTION OF MALIGNANT  RETROPERITONEAL MASS (N/A) LAPAROSCOPIC LIVER ULTRASOUND (N/A) Resection of Meckels  Looks like the postop ileus is finally resolving Adv diet to fulls If tolerates breakfast and lunch will d/c home later today  Discussed with pt and wife discharge instructions  [name]. Andrey Campanile, MD, FACS General, Bariatric, & Minimally Invasive Surgery [facility] Surgery, [place]   LOS: 10 days    [name] 01/20/2013

## 2013-01-21 ENCOUNTER — Telehealth (INDEPENDENT_AMBULATORY_CARE_PROVIDER_SITE_OTHER): Payer: Self-pay | Admitting: General Surgery

## 2013-01-21 MED ORDER — METOCLOPRAMIDE HCL 10 MG PO TABS: 10.0000 mg | ORAL_TABLET | Freq: Three times a day (TID) | ORAL | Status: DC | PRN

## 2013-01-21 MED ORDER — ONDANSETRON HCL 8 MG PO TABS: 8.0000 mg | ORAL_TABLET | Freq: Three times a day (TID) | ORAL | Status: DC | PRN

## 2013-01-21 NOTE — Telephone Encounter (Signed)
Called patient's cell and home number and LMOM at 9:06 and 9:07 for patient to call me back so that I could set up a nurse only visit for him to come in anytime tomorrow 6/24 for staple removal per EW

## 2013-01-21 NOTE — Telephone Encounter (Signed)
Error

## 2013-01-21 NOTE — Telephone Encounter (Signed)
Spoke with wife - pt had a lot of nausea last night. No vomiting, lots of flatus, tolerating small meals. No fever/chills. No abd pain. Sent in rx for reglan and zofran. Told them to call back for worsening pain, n/v

## 2013-01-21 NOTE — Addendum Note (Signed)
Addended by: Andrey Campanile, Garin Mata M on: 01/21/2013 10:04 AM   Modules accepted: Orders

## 2013-01-21 NOTE — Telephone Encounter (Signed)
Patient scheduled for Nurse Visit 6/24 at 3:00 for staple removal.

## 2013-01-21 NOTE — Telephone Encounter (Signed)
Message copied by June Leap on Mon Jan 21, 2013  9:08 AM ------      Message from: [place], ERIC M      Created: Sun Jan 20, 2013  8:25 AM       pls call pt Monday to schedule staple removal for Tuesday            Thanks      wilson ------

## 2013-01-21 NOTE — Telephone Encounter (Signed)
Patient's Wife called in to schedule staple removal for her Husband. She also mentioned patient was nauseated. Started around 8 last evening, no vomiting. Patient had a bland diet for Dinner. Patient feeling better this a.m. Spoke with Wilson,MD he will call patient's Wife and send something over electronically to pharmacy.

## 2013-01-21 NOTE — Telephone Encounter (Signed)
Email sent to [email or web address] to make her aware of add on for GI tumor board.

## 2013-01-21 NOTE — Telephone Encounter (Signed)
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

## 2013-01-22 ENCOUNTER — Ambulatory Visit (INDEPENDENT_AMBULATORY_CARE_PROVIDER_SITE_OTHER): Payer: PRIVATE HEALTH INSURANCE

## 2013-01-22 DIAGNOSIS — D3A8 Other benign neuroendocrine tumors: Secondary | ICD-10-CM

## 2013-01-22 NOTE — Progress Notes (Unsigned)
  Staples to midline incision and drain sites removed.  Areas intact, edges well approximated, no redness, no drainage, no pain at this time.  Pt states he is not having to use pain meds.  Pt having bowel movements daily, eating  well, no fevers.  Dr. Andrey Campanile in to speak with patient.  Dr. Andrey Campanile advised patient it is ok for him to take his ambein as needed for sleep.  Follow up appt made with Dr. Andrey Campanile on 02/13/13 at 8:45am.  Marlowe Aschoff RN, BSN

## 2013-01-23 ENCOUNTER — Telehealth (INDEPENDENT_AMBULATORY_CARE_PROVIDER_SITE_OTHER): Payer: Self-pay | Admitting: General Surgery

## 2013-01-23 DIAGNOSIS — D3500 Benign neoplasm of unspecified adrenal gland: Secondary | ICD-10-CM

## 2013-01-23 NOTE — Addendum Note (Signed)
Addended byLiliana Cline on: 01/23/2013 02:34 PM   Modules accepted: Orders

## 2013-01-23 NOTE — Telephone Encounter (Signed)
I called patient to discuss results of a GI tumor board. No additional oncologic txt needed. Just surveillance for recurrence. However needs a genetics consult b/c some of these types of tumors can run in families.

## 2013-01-23 NOTE — Telephone Encounter (Signed)
Referral entered in EPIC. [facility] to call patient with apt.

## 2013-01-25 NOTE — Discharge Summary (Signed)
Physician Discharge Summary  [name] [facility] ZOX:[id number] DOB: 06-12-1951 DOA: 01/10/2013  PCP: Nathan Dubonnet, MD  Admit date: 01/10/2013 Discharge date: 01/20/13  Recommendations for Outpatient Follow-up:   Follow-up Information   Follow up with Nathan Ina, MD. Schedule an appointment as soon as possible for a visit in 3 weeks.   Contact information:   [address] [place] [address] [phone]       Follow up with [facility]. Schedule an appointment as soon as possible for a visit in 2 days. (for staple removal)    Contact information:   [address] [place] [address] [phone]     Discharge Diagnoses:  1. Paraganglioma - extra-adrenal pheochromcytoma 2. Meckel's diverticulum 3. Postop ileus   Surgical Procedure:  Laparoscopy liver ultrasound, exploratory laparotomy, resection of retroperitoneal malignant neuroendocrine tumor, meckel's diverticulectomy 01/10/13  Discharge Condition: fair Disposition: to home  Diet recommendation: low residue  Filed Weights   01/11/13 0700  Weight: 167 lb 8 oz (75.978 kg)    Hospital Course:  62 year old Caucasian male was admitted for planned resection of a retroperitoneal neuroendocrine tumor. During surgery he was found to have an incidental Meckel's diverticulum which was resected. During surgery & manipulation of the retroperitoneal mass the patient became hypertensive suggesting that the lesion may be a pheochromocytoma. Anesthesia appropriately treated the patient's hypertension. Postoperatively the patient was admitted to the medical surgical floor. He had a PCA as well as On-Q pain pump for postoperative pain control. His nasogastric tube and Foley catheter were subsequently removed. The patient developed a postoperative ileus. Despite passing flatus and having some bowel movements the patient remained nauseated for many days after surgery and having some emesis. Plain film of  the abdomen midweek demonstrated air-fluid levels. His diet was backed down from full liquids to clear liquids. He remained afebrile with stable vital signs. His white count never was elevated. He also complained of back spasms which were treated with Ativan as well as Toradol. because of his persistent episodes of nausea he underwent a CT scan about 8 days after surgery which revealed findings consistent with a postoperative ileus. The CT scan fortunately also served as a cathartic and opened him up. On day of discharge he was tolerating a full diet without nausea or vomiting. He was having flatus and having a bowel movement. His incision was clean dry intact. Vital signs are stable. He was seen stable for discharge   Discharge Instructions   Future Appointments Provider Department Dept Phone   02/13/2013 8:45 AM Nathan Ina, MD [facility], [place] [phone]       Medication List    STOP taking these medications       levofloxacin 500 MG tablet  Commonly known as:  LEVAQUIN      TAKE these medications       cyclobenzaprine 10 MG tablet  Commonly known as:  FLEXERIL  Take 1 tablet (10 mg total) by mouth 3 (three) times daily.     HYDROcodone-acetaminophen 5-325 MG per tablet  Commonly known as:  NORCO  Take 1 tablet by mouth every 6 (six) hours as needed for pain.     ibuprofen 200 MG tablet  Commonly known as:  ADVIL,MOTRIN  Take 400 mg by mouth every 6 (six) hours as needed for pain or headache.     valsartan-hydrochlorothiazide 160-12.5 MG per tablet  Commonly known as:  DIOVAN-HCT  Take 1 tablet by mouth every  morning.           Follow-up Information   Follow up with Nathan Ina, MD. Schedule an appointment as soon as possible for a visit in 3 weeks.   Contact information:   [address] [place] [address] [phone]       Follow up with [facility]. Schedule an appointment as soon as possible for a visit in 2 days. (for  staple removal)    Contact information:   [address] [place] [address] [phone]       The results of significant diagnostics from this hospitalization (including imaging, microbiology, ancillary and laboratory) are listed below for reference.    Significant Diagnostic Studies: Dg Chest 2 View  01/03/2013   *RADIOLOGY REPORT*  Clinical Data: Preop for resection of retroperitoneal mass.  CHEST - 2 VIEW  Comparison: None.  Findings: The cardiac silhouette, mediastinal and hilar contours are normal.  The lungs are clear.  No pleural effusion.  The bony thorax is intact.  IMPRESSION: No acute cardiopulmonary findings.   Original Report Authenticated By: Rudie Meyer, M.D.   Ct Abdomen Pelvis W Contrast  01/18/2013   *RADIOLOGY REPORT*  Clinical Data: Nausea and abdominal distention.  Recent surgery  CT ABDOMEN AND PELVIS WITH CONTRAST  Technique:  Multidetector CT imaging of the abdomen and pelvis was performed following the standard protocol during bolus administration of intravenous contrast.  Contrast: OMNIPAQUE IOHEXOL 300 MG/ML  SOLN  Comparison: CT scan 11/15/2012.  Findings: The lung bases demonstrate bibasilar atelectasis, right greater than left.  The heart is normal in size.  No pericardial effusion.  Stable mild fusiform dilatation of the ascending aorta.  The liver is unremarkable.  No focal hepatic lesions or intrahepatic biliary dilatation.  The gallbladder is mildly distended.  No inflammatory changes.  No common bile duct dilatation.  The pancreas is normal.  The spleen is normal.  The adrenal glands and kidneys are normal.  The stomach is distended with contrast.  The duodenum is normal. The small bowel is dilated with scattered air fluid levels.  The distal and terminal ileum are normal in caliber/decompressed. Expected postoperative changes near the periaortic mass resection with a small amount of fluid and surgical clips.  There is a small amount of residual  free air.  The colon is relatively decompressed. Although the findings could reflect a postoperative ileus I am somewhat suspicious there may be a very distal small bowel obstruction.  The appearance of the distal ileal loops compared to the more proximal small bowel.  An exact site for obstruction is not identified for certain.  The aorta and branch vessels are stable.  No mesenteric or retroperitoneal mass or adenopathy.  The bladder is mildly distended.  The prostate gland and seminal vesicles are unremarkable.  There is a small amount of free pelvic fluid which is likely postoperative.  The anterior abdominal wall skin staples are noted.  Mild diffuse but asymmetric subcutaneous edema.  IMPRESSION:  1.  Dilated small bowel with air-fluid levels transitioning to normal/decompressed distal and terminal ileum.  Findings suspicious for early small bowel obstruction. 2.  Expected postoperative changes with free air and free fluid. 3.  Bibasilar atelectasis.   Original Report Authenticated By: Rudie Meyer, M.D.   Dg Abd Acute W/chest  01/16/2013   *RADIOLOGY REPORT*  Clinical Data: Postop day 6 laparotomy.  Nausea vomiting  ACUTE ABDOMEN SERIES (ABDOMEN 2 VIEW & CHEST 1 VIEW)  Comparison: 01/03/2013  Findings: Right lower lobe airspace disease has developed since the prior study and  may represent atelectasis or pneumonia.  No heart failure.  Minimal left effusion.  Dilated small bowel loops in the left abdomen.  Colon is decompressed.  There is no air in the rectum.  There is no free air.  Midline skin staples are noted.  IMPRESSION: Interval development of right lower lobe atelectasis or infiltrate.  Dilated small bowel may represent postop ileus or small bowel obstruction.  Continued follow-up is suggested.   Original Report Authenticated By: Janeece Riggers, M.D.    Microbiology: No results found for this or any previous visit (from the past 240 hour(s)).   Labs: Basic Metabolic Panel:  Recent Labs Lab  01/19/13 0445 01/20/13 0525  NA 129* 127*  K 3.7 3.6  CL 93* 94*  CO2 24 24  GLUCOSE 108* 85  BUN 8 13  CREATININE 1.23 1.31  CALCIUM 9.1 8.6  MG 1.9  --     CBC:  Recent Labs Lab 01/19/13 0445  WBC 9.9  NEUTROABS 7.1  HGB 12.2*  HCT 35.1*  MCV 85.0  PLT 288    Active Problems:   Retroperitoneal Neuroendocrine tumor   Time coordinating discharge: 10 minute  Signed:  [name], MD [facility], [place] [phone] 01/25/2013, 7:18 AM

## 2013-02-13 ENCOUNTER — Other Ambulatory Visit (INDEPENDENT_AMBULATORY_CARE_PROVIDER_SITE_OTHER): Payer: Self-pay

## 2013-02-13 ENCOUNTER — Ambulatory Visit (INDEPENDENT_AMBULATORY_CARE_PROVIDER_SITE_OTHER): Payer: PRIVATE HEALTH INSURANCE | Admitting: General Surgery

## 2013-02-13 ENCOUNTER — Encounter (INDEPENDENT_AMBULATORY_CARE_PROVIDER_SITE_OTHER): Payer: Self-pay | Admitting: General Surgery

## 2013-02-13 VITALS — BP 150/96 | HR 100 | Resp 14 | Ht 70.0 in | Wt 158.0 lb

## 2013-02-13 DIAGNOSIS — Z86018 Personal history of other benign neoplasm: Secondary | ICD-10-CM

## 2013-02-13 DIAGNOSIS — Z862 Personal history of diseases of the blood and blood-forming organs and certain disorders involving the immune mechanism: Secondary | ICD-10-CM

## 2013-02-13 NOTE — Patient Instructions (Signed)
Try taking either a pain pill or muscle relaxant at night to help with sleep Can resume full activities at the end of the month

## 2013-02-13 NOTE — Progress Notes (Signed)
Subjective:     Patient ID: [name], male   DOB: 1950-09-14, 62 y.o.   MRN: [id number]  HPI 62 year old Caucasian male comes in after being in the hospital from June 12 to June 22 for exploratory laparotomy for removal of a malignant retroperitoneal neuroendocrine mass which turned out to be a pheochromocytoma. I briefly saw the patient in the office on June 24 when he came in for staple removal.He states he is doing well. He does have some minor issues. He is no longer taking any Reglan,  antiemetics or pain medication. He reports a good appetite. He denies any diarrhea or constipation. He denies any fevers or chills. He denies any abdominal pain per se. His main issue is some discomfort around his incision. He states it bothers him if he sits upright for several hours in a row. He states he still has difficulty laying on his side when trying to sleep at night with discomfort in his abdomen wall. He has had trouble sleeping as well. He states there is a lot going on at work right now  Review of Systems     Objective:   Physical Exam BP 150/96  Pulse 100  Resp 14  Ht 5\' 10"  (1.778 m)  Wt 158 lb (71.668 kg)  BMI 22.67 kg/m2 Alert, NAD cta b/l Reg  Soft, nd, nt. Well healed midline incision. No hernia    Assessment:     62 year old gentleman status post exploratory laparotomy, resection of malignant retroperitoneal neuroendocrine mass, pheochromocytoma     Plan:     I already discussed his pathology report with him and his wife as well as the results of the GI tumor board discussion. The patient had a peri-aortic extra-adrenal pheochromocytoma. He was also found to have some enlarged small bowel mesenteric nodes. We biopsied one of those nnodes and it came back benign.  He has not heard from [place] genetics regarding his genetics consult because of the possibility of this having a familial component. We will try to followup and make sure that he gets called back and help to get  his genetics consult  I explained that his abdominal wall is still healing. I explained it is common to have some discomfort in the abdominal wall if he stays in the same position for a prolonged period of time. I encouraged him to get up and move around more frequently. I explained that in my opinion his difficulty sleeping is probably because he still has some inherent abdominal wall discomfort. I advised him to take either a muscle relaxant for one pain pill prior to bedtime to see if that helps. I explained that it should improve with further time.  He was released to do light Cardio to be such as treadmill or elliptical. I told him he do light weight lifting. However he should wait another 2 weeks before he does any heavy strength training. I told him he can play tennis Followup with me in 3 months with plasma metanephrines prior to visit  [name]. Andrey Campanile, MD, FACS General, Bariatric, & Minimally Invasive Surgery [facility], [place]

## 2013-04-22 ENCOUNTER — Encounter: Payer: PRIVATE HEALTH INSURANCE | Admitting: Genetic Counselor

## 2013-04-22 ENCOUNTER — Other Ambulatory Visit: Payer: PRIVATE HEALTH INSURANCE | Admitting: Lab

## 2013-05-16 ENCOUNTER — Encounter (INDEPENDENT_AMBULATORY_CARE_PROVIDER_SITE_OTHER): Payer: Self-pay | Admitting: General Surgery

## 2013-05-16 ENCOUNTER — Ambulatory Visit (INDEPENDENT_AMBULATORY_CARE_PROVIDER_SITE_OTHER): Payer: PRIVATE HEALTH INSURANCE | Admitting: General Surgery

## 2013-05-16 VITALS — BP 150/90 | HR 84 | Temp 99.2°F | Resp 15 | Ht 70.0 in | Wt 168.2 lb

## 2013-05-16 DIAGNOSIS — Z86018 Personal history of other benign neoplasm: Secondary | ICD-10-CM

## 2013-05-16 DIAGNOSIS — Z862 Personal history of diseases of the blood and blood-forming organs and certain disorders involving the immune mechanism: Secondary | ICD-10-CM

## 2013-05-16 NOTE — Patient Instructions (Signed)
Get your blood work done See you in 3 months  Fractionated Plasma-Free Metanephrines This is a test to help diagnose or rule out a pheochromocytoma (tumor of the adrenal gland). It may be done if you have symptoms of persistent or episodic high blood pressure such as severe headaches, rapid heart rate, and sweating.  The plasma free metanephrines test measures the amount of metanephrine and normetanephrine in the blood. These substances are metabolites (breakdown products) of epinephrine and norepinephrine. Epinephrine and norepinephrine are catecholamine hormones that help regulate the flow and pressure of blood throughout the body and play important roles in the body's response to stress. They are produced in the adrenal glands. Each person has two adrenal glands located on top of the kidneys. The catecholamines that the adrenal glands create, and their metabolites (metanephrine and normetanephrine), are normally found in small fluctuating quantities in both the blood and urine.  SAMPLE COLLECTION A blood sample is obtained by inserting a needle into a vein in your arm. Talk to your caregiver about your diet and any medications you are taking. You should discontinue epinephrine and epinephrine-like drugs for at least one week before the test, stop using acetaminophen 48 hours before, and fast (no food or liquids other than water) for about 8 to 10 hours prior to the test. It is especially important not to have any caffeine, coffee (including decaf), tobacco, tea, or alcohol for at least four hours before specimen collection.  MEANING OF TEST  Your caregiver will go over the test results with you and discuss the importance and meaning of your results, as well as treatment options and the need for additional tests if necessary. OBTAINING THE TEST RESULTS  It is your responsibility to obtain your test results. Ask the lab or department performing the test when and how you will get your results. Document  Released: 08/20/2004 Document Revised: 10/10/2011 Document Reviewed: 04/27/2005 [facility] Patient Information 2014 [place], [place].

## 2013-05-20 NOTE — Progress Notes (Signed)
Subjective:     Patient ID: [name], male   DOB: Aug 29, 1950, 62 y.o.   MRN: [id number]  HPI 62 year old Caucasian male comes in for followup regarding his extra adrenal pheochromocytoma. He underwent exploratory laparotomy for periaortic retroperitoneal mass in June 2014. It turned out to be a paraganglioma.  He had a prolonged postoperative course in the hospital mainly due to the severe postoperative ileus. He states that he has been doing well. He reports an excellent appetite. He denies any abdominal pain. He denies any fevers, chills, nausea,, diarrhea or constipation. He denies any weight loss. He states that he gained the weight back that he lost while in the hospital. He is no longer taking any anti-emetics or Reglan. He reports a good energy level. He denies any palpitations. He denies any flushing.  PMHx, PSHx, SOCHx, FAMHx, ALL reviewed and unchanged  Review of Systems 12 point ROS performed and negative except for what is mentioned in HPI    Objective:   Physical Exam BP 150/90  Pulse 84  Temp(Src) 99.2 F (37.3 C) (Temporal)  Resp 15  Ht 5\' 10"  (1.778 m)  Wt 168 lb 3.2 oz (76.295 kg)  BMI 24.13 kg/m2  Gen: alert, NAD, non-toxic appearing Pupils: equal, no scleral icterus Pulm: Lungs clear to auscultation, symmetric chest rise CV: regular rate and rhythm Abd: soft, nontender, nondistended. Well-healed midline incision. No cellulitis. No incisional hernia Ext: no edema, no calf tenderness Skin: no rash, no jaundice     Assessment:     Status post exploratory laparoscopy, laparoscopic liver ultrasound, resection of retroperitoneal paraganglioma     Plan:     He appears to be doing well. He has started to resume full activities. With respect oncologic surveillance, we will check plasma metanephrines. Followup 3 months. Clinically no evidence of disease  [name]. Andrey Campanile, MD, FACS General, Bariatric, & Minimally Invasive Surgery [facility], [place]

## 2013-06-06 ENCOUNTER — Other Ambulatory Visit: Payer: Self-pay

## 2013-07-17 ENCOUNTER — Encounter (INDEPENDENT_AMBULATORY_CARE_PROVIDER_SITE_OTHER): Payer: Self-pay | Admitting: General Surgery

## 2013-08-20 ENCOUNTER — Encounter (INDEPENDENT_AMBULATORY_CARE_PROVIDER_SITE_OTHER): Payer: Self-pay | Admitting: General Surgery

## 2013-08-21 ENCOUNTER — Other Ambulatory Visit (INDEPENDENT_AMBULATORY_CARE_PROVIDER_SITE_OTHER): Payer: Self-pay | Admitting: General Surgery

## 2013-08-21 ENCOUNTER — Telehealth (INDEPENDENT_AMBULATORY_CARE_PROVIDER_SITE_OTHER): Payer: Self-pay | Admitting: General Surgery

## 2013-08-21 DIAGNOSIS — D3500 Benign neoplasm of unspecified adrenal gland: Secondary | ICD-10-CM

## 2013-08-21 NOTE — Telephone Encounter (Signed)
Called patient and spoke to him that Dr Redmond Pulling had stated that he can have lab work done and Dr Redmond Pulling sent the patient a My-chart message and telling him what to do before getting his lab work done and I told the patient to read the message and go to the [facility]. . Patient can not have any epinephrine and epinephrine-like drugs for at least one week before the test, stop using acetaminophen 48 hours before, and fast ( no food or liquids other than water) for about 8 to 10 hours prior to the test. It is especially important not to have any caffeine,coffee ( including decaf), tobacco,tea, or alcohol for at least four hours before specimen collection.

## 2013-08-25 LAB — METANEPHRINES, PLASMA
Metanephrine, Free: 55 pg/mL (ref <=57)
Normetanephrine, Free: 81 pg/mL (ref <=148)
Total Metanephrines-Plasma: 136 pg/mL (ref <=205)

## 2014-04-13 IMAGING — CT CT ABD-PELV W/ CM
2 of 5 series · 13 of 32 positions shown, 18 images · IV contrast (water/omni  & 100ml omni 300)
Comparison: CT scan 11/15/2012.

CLINICAL DATA: Nausea and abdominal distention.  Recent surgery

CT ABDOMEN AND PELVIS WITH CONTRAST
TECHNIQUE: Multidetector CT imaging of the abdomen and pelvis was
performed following the standard protocol during bolus
administration of intravenous contrast.
Contrast: 100mL OMNIPAQUE IOHEXOL 300 MG/ML  SOLN

[Series 2: routine abdomen · axial · 0.92mm/px · z∈[-158,+192]mm · 6 of 98 slices shown, 11 images]
[im 14/98  soft-tissue]
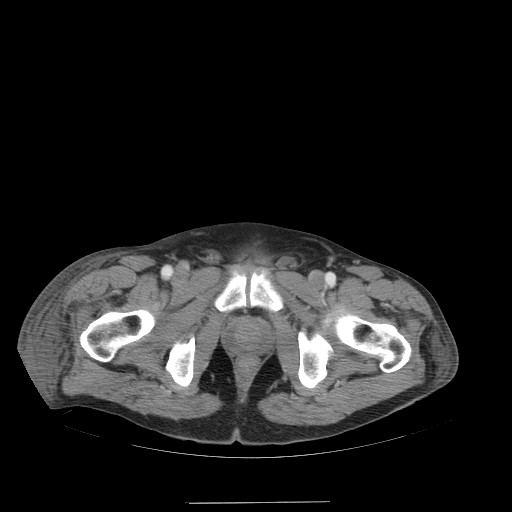
[im 14/98  bone]
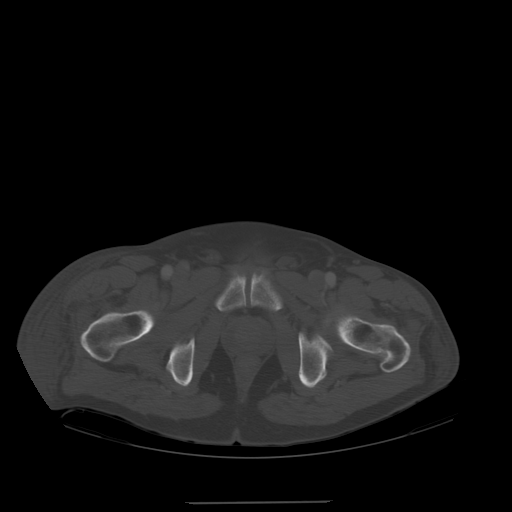
[im 28/98  soft-tissue]
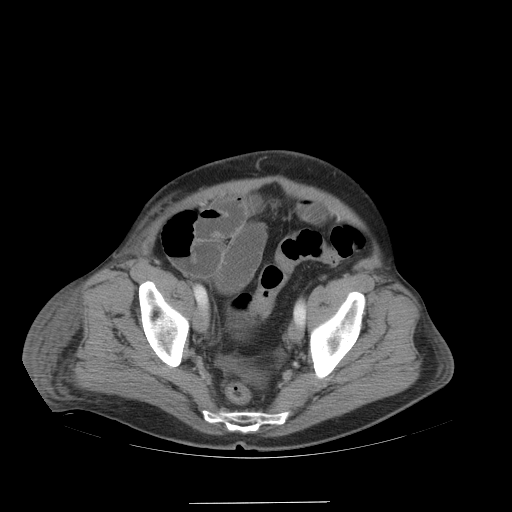
[im 42/98  soft-tissue]
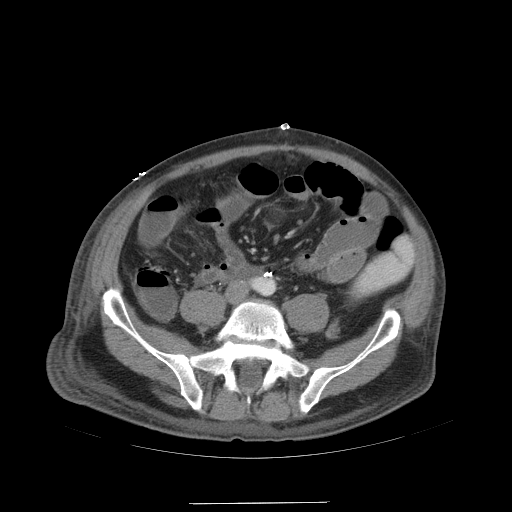
[im 42/98  lung]
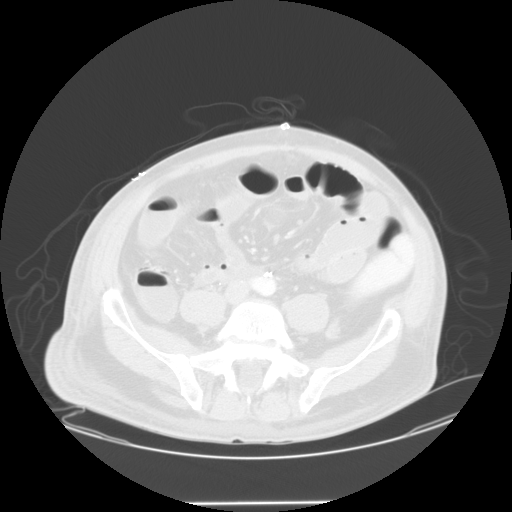
[im 56/98  soft-tissue]
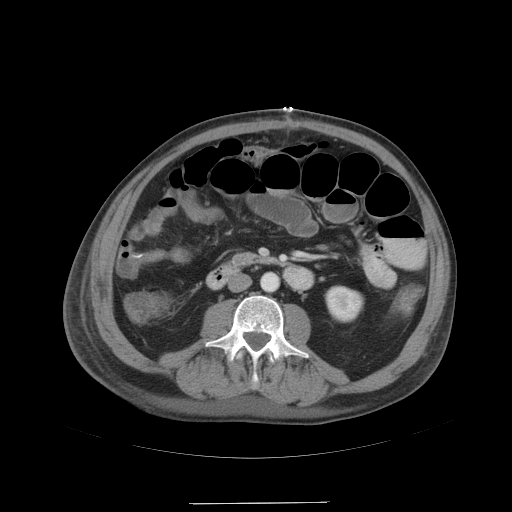
[im 56/98  lung]
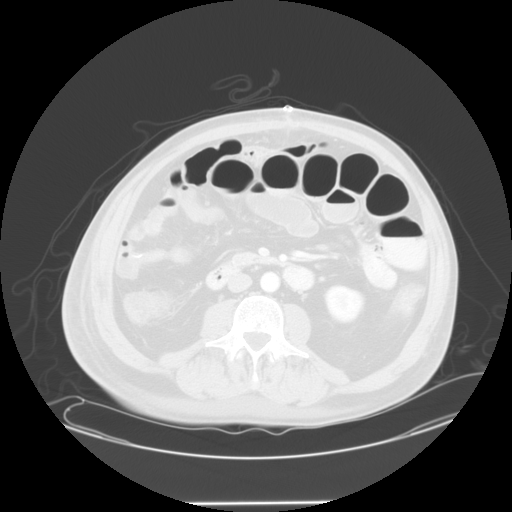
[im 70/98  soft-tissue]
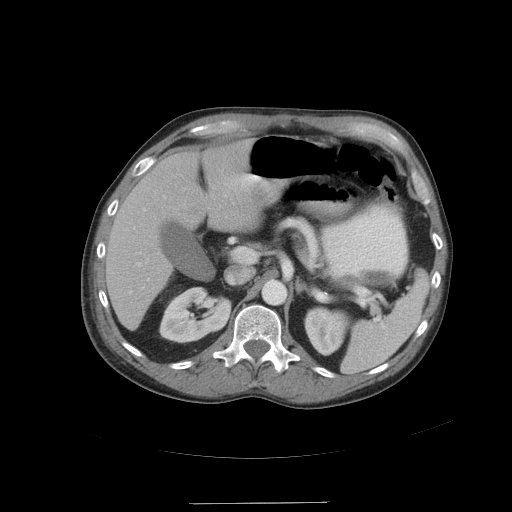
[im 70/98  lung]
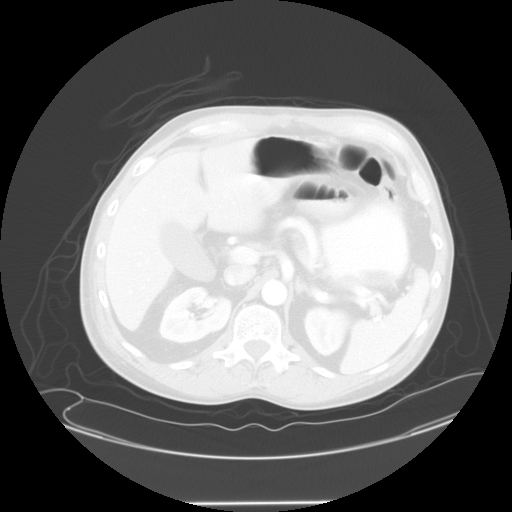
[im 84/98  soft-tissue]
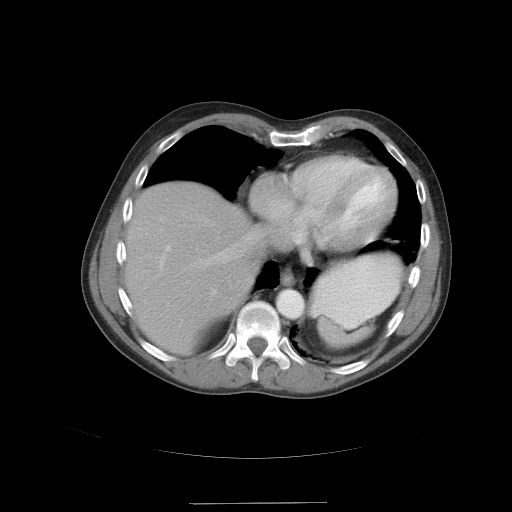
[im 84/98  lung]
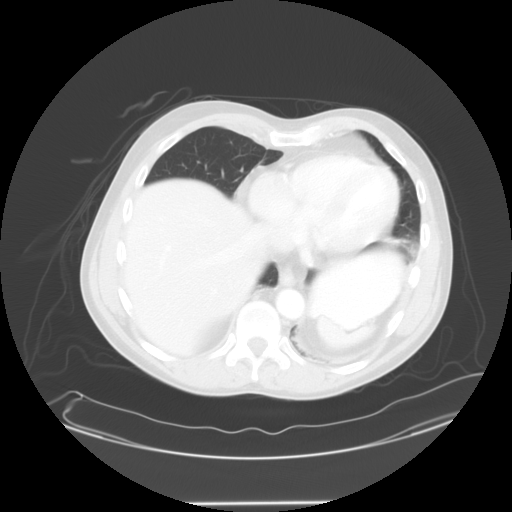

[Series 400: coronals · coronal · 0.97mm/px · 7 of 104 slices shown]
[im 13/104  soft-tissue]
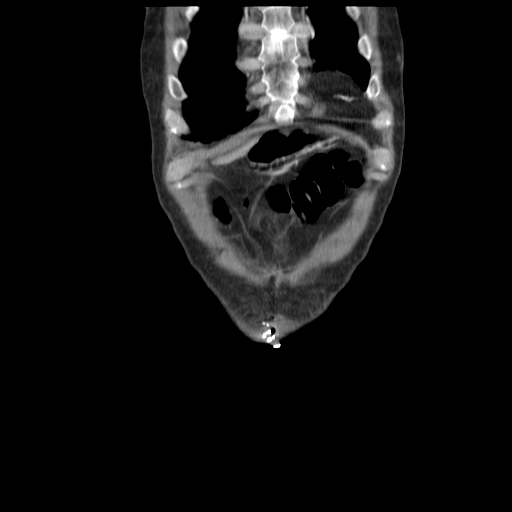
[im 26/104  soft-tissue]
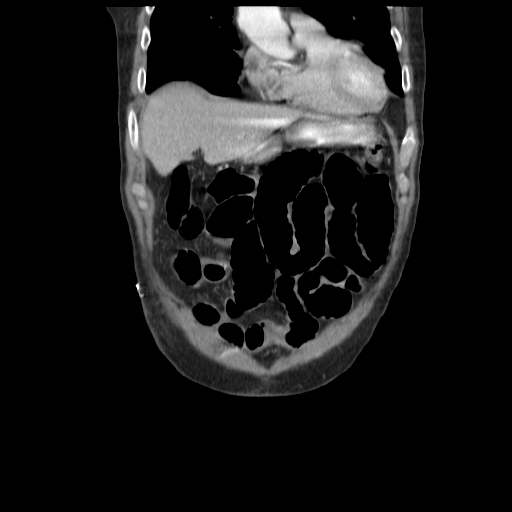
[im 39/104  soft-tissue]
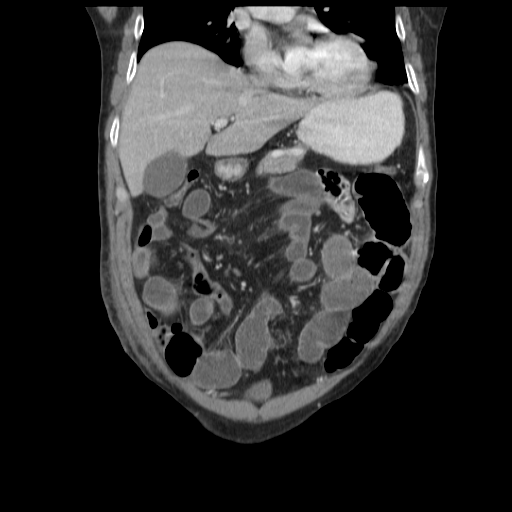
[im 52/104  soft-tissue]
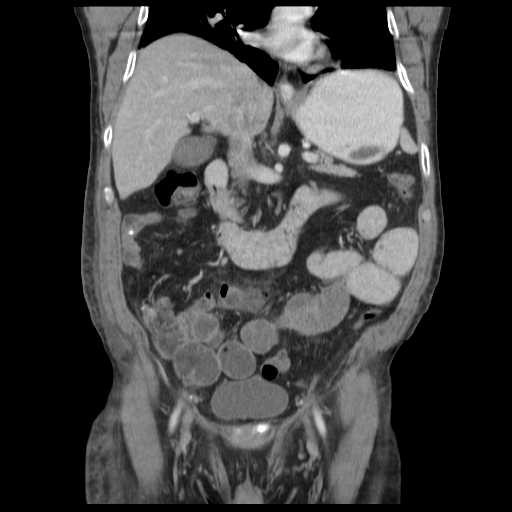
[im 65/104  soft-tissue]
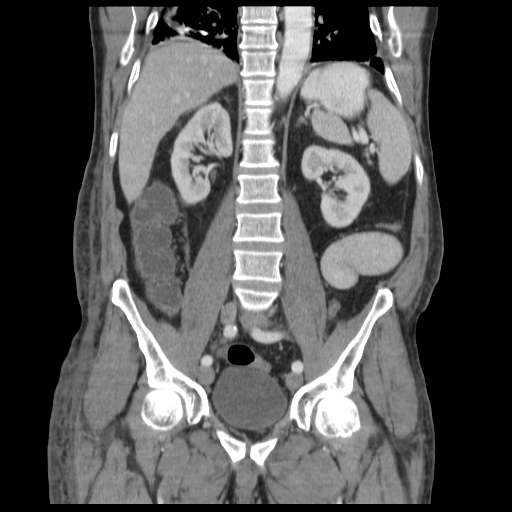
[im 78/104  soft-tissue]
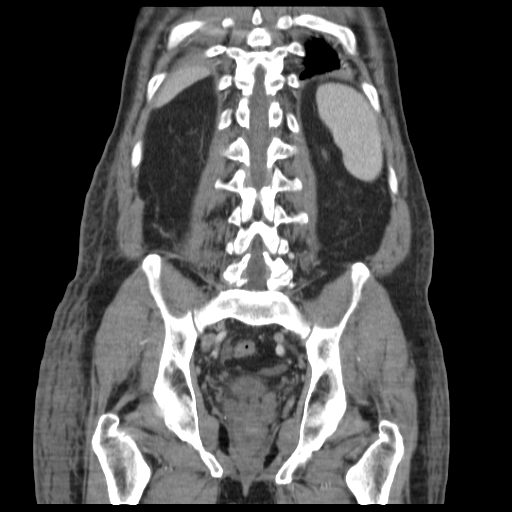
[im 91/104  soft-tissue]
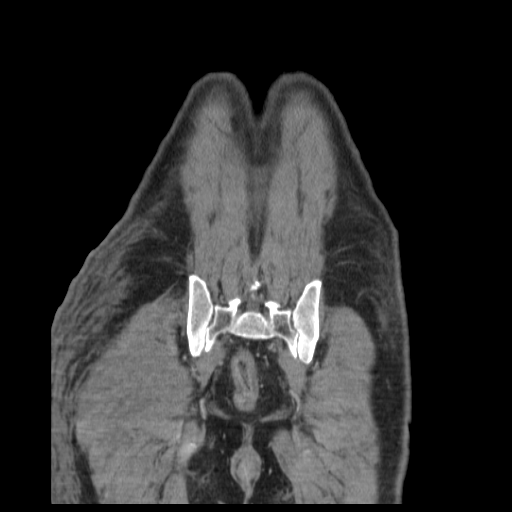

[13 of 32 positions shown; findings below may reference images not displayed]

FINDINGS: The lung bases demonstrate bibasilar atelectasis, right
greater than left.  The heart is normal in size.  No pericardial
effusion.  Stable mild fusiform dilatation of the ascending aorta.

The liver is unremarkable.  No focal hepatic lesions or
intrahepatic biliary dilatation.  The gallbladder is mildly
distended.  No inflammatory changes.  No common bile duct
dilatation.  The pancreas is normal.  The spleen is normal.  The
adrenal glands and kidneys are normal.

The stomach is distended with contrast.  The duodenum is normal.
The small bowel is dilated with scattered air fluid levels.  The
distal and terminal ileum are normal in caliber/decompressed.
Expected postoperative changes near the periaortic mass resection
with a small amount of fluid and surgical clips.  There is a small
amount of residual free air.  The colon is relatively decompressed.
Although the findings could reflect a postoperative ileus I am
somewhat suspicious there may be a very distal small bowel
obstruction.  The appearance of the distal ileal loops compared to
the more proximal small bowel.  An exact site for obstruction is
not identified for certain.

The aorta and branch vessels are stable.  No mesenteric or
retroperitoneal mass or adenopathy.

The bladder is mildly distended.  The prostate gland and seminal
vesicles are unremarkable.  There is a small amount of free pelvic
fluid which is likely postoperative.  The anterior abdominal wall
skin staples are noted.  Mild diffuse but asymmetric subcutaneous
edema.
IMPRESSION: 1.  Dilated small bowel with air-fluid levels transitioning to
normal/decompressed distal and terminal ileum.  Findings suspicious
for early small bowel obstruction.
2.  Expected postoperative changes with free air and free fluid.
3.  Bibasilar atelectasis.

## 2015-02-17 ENCOUNTER — Ambulatory Visit
Admission: RE | Admit: 2015-02-17 | Discharge: 2015-02-17 | Disposition: A | Discharge status: Home / Self Care | Payer: PRIVATE HEALTH INSURANCE | Source: Ambulatory Visit | Attending: Internal Medicine | Admitting: Internal Medicine

## 2015-02-17 ENCOUNTER — Other Ambulatory Visit: Payer: Self-pay | Admitting: Internal Medicine

## 2015-02-17 DIAGNOSIS — M5489 Other dorsalgia: Secondary | ICD-10-CM

## 2015-09-04 DIAGNOSIS — L308 Other specified dermatitis: Secondary | ICD-10-CM | POA: Diagnosis not present

## 2015-11-05 ENCOUNTER — Emergency Department (HOSPITAL_COMMUNITY)
Admission: EM | Admit: 2015-11-05 | Discharge: 2015-11-05 | Disposition: A | Discharge status: Home / Self Care | Payer: Medicare Other | Attending: Emergency Medicine | Admitting: Emergency Medicine

## 2015-11-05 ENCOUNTER — Encounter (HOSPITAL_COMMUNITY): Payer: Self-pay | Admitting: Emergency Medicine

## 2015-11-05 DIAGNOSIS — Z8701 Personal history of pneumonia (recurrent): Secondary | ICD-10-CM | POA: Insufficient documentation

## 2015-11-05 DIAGNOSIS — Z85038 Personal history of other malignant neoplasm of large intestine: Secondary | ICD-10-CM | POA: Insufficient documentation

## 2015-11-05 DIAGNOSIS — W208XXA Other cause of strike by thrown, projected or falling object, initial encounter: Secondary | ICD-10-CM | POA: Diagnosis not present

## 2015-11-05 DIAGNOSIS — S0990XA Unspecified injury of head, initial encounter: Secondary | ICD-10-CM | POA: Diagnosis present

## 2015-11-05 DIAGNOSIS — Y9389 Activity, other specified: Secondary | ICD-10-CM | POA: Insufficient documentation

## 2015-11-05 DIAGNOSIS — Z23 Encounter for immunization: Secondary | ICD-10-CM | POA: Diagnosis not present

## 2015-11-05 DIAGNOSIS — Z8739 Personal history of other diseases of the musculoskeletal system and connective tissue: Secondary | ICD-10-CM | POA: Diagnosis not present

## 2015-11-05 DIAGNOSIS — Y998 Other external cause status: Secondary | ICD-10-CM | POA: Insufficient documentation

## 2015-11-05 DIAGNOSIS — I10 Essential (primary) hypertension: Secondary | ICD-10-CM | POA: Diagnosis not present

## 2015-11-05 DIAGNOSIS — Y9289 Other specified places as the place of occurrence of the external cause: Secondary | ICD-10-CM | POA: Insufficient documentation

## 2015-11-05 DIAGNOSIS — S0101XA Laceration without foreign body of scalp, initial encounter: Secondary | ICD-10-CM | POA: Diagnosis not present

## 2015-11-05 MED ORDER — TETANUS-DIPHTH-ACELL PERTUSSIS 5-2.5-18.5 LF-MCG/0.5 IM SUSP
0.5000 mL | Freq: Once | INTRAMUSCULAR | Status: AC
  Administered 2015-11-05: 0.5 mL via INTRAMUSCULAR
  Filled 2015-11-05: qty 0.5

## 2015-11-05 MED ORDER — LIDOCAINE-EPINEPHRINE (PF) 2 %-1:200000 IJ SOLN
20.0000 mL | Freq: Once | INTRAMUSCULAR | Status: AC
  Administered 2015-11-05: 20 mL via INTRADERMAL
  Filled 2015-11-05: qty 20

## 2015-11-05 NOTE — ED Notes (Signed)
Pt bp elevated at d/c.  Per MD, ok to send home

## 2015-11-05 NOTE — ED Provider Notes (Signed)
CSN: [id number]     Arrival date & time 11/05/15  [id number] History   First MD Initiated Contact with Patient 11/05/15 1003     Chief Complaint  Patient presents with  . Head Laceration     (Consider location/radiation/quality/duration/timing/severity/associated sxs/prior Treatment) HPI [name] is a 65 y.o. male with hx of HTN, presents to ED with complaint of head laceration. Pt states he was moving furniture today when a bed rail fell and hit him in the back of the head. No LOC. No headache. Pt continued to move furniture despite bleeding from his scalp. States applied pressure. When was done decided to come to have laceration repaired. Pt denies Nausea, vomiting. No amnesia. No confusion. No changes in vision. No difficulty ambulating. No numbness or weakness in his extremities. Tetanus is unknown. Patient takes aspirin intermittently, no other blood thinners  Past Medical History  Diagnosis Date  . Hypertension   . Gout   . Pneumonia   . 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 Inguinal hernia repair  1985    right  . Esophagogastroduodenoscopy N/A 10/24/2012    Procedure: ESOPHAGOGASTRODUODENOSCOPY (EGD);  Surgeon: Arta Silence, MD;  Location: [facility];  Service: Endoscopy;  Laterality: N/A;  . Eus N/A 10/24/2012    Procedure: ESOPHAGEAL ENDOSCOPIC ULTRASOUND (EUS) RADIAL;  Surgeon: Arta Silence, MD;  Location: [facility];  Service: Endoscopy;  Laterality: N/A;  + or - FNA  . Fine needle aspiration N/A 10/24/2012    Procedure: FINE NEEDLE ASPIRATION (FNA) LINEAR;  Surgeon: Arta Silence, MD;  Location: [facility] ENDOS[facility];  Service: Endoscopy;  Laterality: N/A;  . Tonsillectomy    . Colonoscopy      Hx: of  . 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 Laparoscopy N/A 01/10/2013    Procedure: LAPAROSCOPY DIAGNOSTIC;  Surgeon: Gayland Curry, MD;  Location: [place];  Service: General;  Laterality: N/A;  . Laparoscopic liver  ultrasound N/A 01/10/2013    Procedure: LAPAROSCOPIC LIVER ULTRASOUND;  Surgeon: Gayland Curry, MD;  Location: [facility];  Service: General;  Laterality: N/A;   Family History  Problem Relation Age of Onset  . Heart disease Father    Social History  Substance Use Topics  . Smoking status: Never Smoker   . Smokeless tobacco: Never Used  . Alcohol Use: Yes     Comment: 2-3 per day    Review of Systems  Constitutional: Negative for fever and chills.  Eyes: Negative for visual disturbance.  Musculoskeletal: Negative for back pain and neck pain.  Skin: Positive for wound.  Neurological: Negative for dizziness, tremors, syncope, speech difficulty, weakness, light-headedness, numbness and headaches.  All other systems reviewed and are negative.     Allergies  Sulfa antibiotics  Home Medications   Prior to Admission medications   Medication Sig Start Date End Date Taking? Authorizing Provider  ibuprofen (ADVIL,MOTRIN) 200 MG tablet Take 400 mg by mouth every 6 (six) hours as needed for pain or headache.   Yes Historical Provider, MD  valsartan-hydrochlorothiazide (DIOVAN-HCT) 160-12.5 MG per tablet Take 1 tablet by mouth every morning.   Yes Historical Provider, MD  cyclobenzaprine (FLEXERIL) 10 MG tablet Take 1 tablet (10 mg total) by mouth 3 (three) times daily. Patient not taking: Reported on 11/05/2015 01/20/13   Greer Pickerel, MD  metoCLOPramide (REGLAN) 10 MG tablet Take 1 tablet (10 mg total) by mouth 3 (three) times daily as needed. Patient not  taking: Reported on 11/05/2015 01/21/13   Greer Pickerel, MD  ondansetron (ZOFRAN) 8 MG tablet Take 1 tablet (8 mg total) by mouth every 8 (eight) hours as needed for nausea. Patient not taking: Reported on 11/05/2015 01/21/13   Greer Pickerel, MD   BP 134/90 mmHg  Pulse 90  Temp(Src) 97.5 F (36.4 C) (Oral)  Resp 18  SpO2 96% Physical Exam  Constitutional: He is oriented to person, place, and time. He appears well-developed and well-nourished. No  distress.  HENT:  Head: Normocephalic and atraumatic.  Eyes: Conjunctivae and EOM are normal. Pupils are equal, round, and reactive to light.  Neck: Neck supple.  Cardiovascular: Normal rate, regular rhythm and normal heart sounds.   Pulmonary/Chest: Effort normal and breath sounds normal. No respiratory distress. He has no wheezes. He has no rales.  Musculoskeletal: He exhibits no edema.  Neurological: He is alert and oriented to person, place, and time.  5/5 and equal upper and lower extremity strength bilaterally. Equal grip strength bilaterally. Normal finger to nose and heel to shin. No pronator drift. Gait is normal  Skin: Skin is warm and dry.  5cm laceration to posterior scalp. Hemostatic at this time. gaping  Nursing note and vitals reviewed.   ED Course  Procedures (including critical care time) Labs Review Labs Reviewed - No data to display  Imaging Review No results found. I have personally reviewed and evaluated these images and lab results as part of my medical decision-making.   EKG Interpretation None      LACERATION REPAIR Performed by: Renold Genta Authorized by: Jeannett Senior A Consent: Verbal consent obtained. Risks and benefits: risks, benefits and alternatives were discussed Consent given by: patient Patient identity confirmed: provided demographic data Prepped and Draped in normal sterile fashion Wound explored  Laceration Location: scalp  Laceration Length: 5cm  No Foreign Bodies seen or palpated  Anesthesia: local infiltration  Local anesthetic: lidocaine 2% w epinephrine  Anesthetic total: 3 ml  Irrigation method: syringe Amount of cleaning: standard  Skin closure: staples  Number of sutures: 6  Patient tolerance: Patient tolerated the procedure well with no immediate complications.  MDM   Final diagnoses:  Scalp laceration, initial encounter  Minor head injury, initial encounter    patients with laceration to  the scalp after getting hit in the head with a frame of the bed while moving furniture. No loss of consciousness. Was able to continue move furniture. He denies any associated complaints. Specifically no dizziness, seizure, confusion, vomiting. Bleeding controlled with pressure. He is not on anticoagulants. He is neurovascularly intact. Laceration repaired with staples. I don't think patient needs any imaging at this time. Will discharge home with return precautions and follow-up for staple removal in 10 days.  Filed Vitals:   11/05/15 0957 11/05/15 1119  BP: 134/90 147/111  Pulse: 90 79  Temp: 97.5 F (36.4 C)   TempSrc: Oral   Resp: 18 18  SpO2: 96% 95%     Jeannett Senior, PA-C 11/05/15 [place], MD 11/06/15 [phone]

## 2015-11-05 NOTE — Discharge Instructions (Signed)
Take ibuprofen or Tylenol for pain. Wash your hair like he normally would. Avoid brushing or scratching the area. You can apply triple antibiotic ointment twice a day to the laceration. Follow-up in 10 days for staple removal  Laceration Care, Adult A laceration is a cut that goes through all of the layers of the skin and into the tissue that is right under the skin. Some lacerations heal on their own. Others need to be closed with stitches (sutures), staples, skin adhesive strips, or skin glue. Proper laceration care minimizes the risk of infection and helps the laceration to heal better. HOW TO CARE FOR YOUR LACERATION If sutures or staples were used:  Keep the wound clean and dry.  If you were given a bandage (dressing), you should change it at least one time per day or as told by your health care provider. You should also change it if it becomes wet or dirty.  Keep the wound completely dry for the first 24 hours or as told by your health care provider. After that time, you may shower or bathe. However, make sure that the wound is not soaked in water until after the sutures or staples have been removed.  Clean the wound one time each day or as told by your health care provider:  Wash the wound with soap and water.  Rinse the wound with water to remove all soap.  Pat the wound dry with a clean towel. Do not rub the wound.  After cleaning the wound, apply a thin layer of antibiotic ointmentas told by your health care provider. This will help to prevent infection and keep the dressing from sticking to the wound.  Have the sutures or staples removed as told by your health care provider. If skin adhesive strips were used:  Keep the wound clean and dry.  If you were given a bandage (dressing), you should change it at least one time per day or as told by your health care provider. You should also change it if it becomes dirty or wet.  Do not get the skin adhesive strips wet. You may shower  or bathe, but be careful to keep the wound dry.  If the wound gets wet, pat it dry with a clean towel. Do not rub the wound.  Skin adhesive strips fall off on their own. You may trim the strips as the wound heals. Do not remove skin adhesive strips that are still stuck to the wound. They will fall off in time. If skin glue was used:  Try to keep the wound dry, but you may briefly wet it in the shower or bath. Do not soak the wound in water, such as by swimming.  After you have showered or bathed, gently pat the wound dry with a clean towel. Do not rub the wound.  Do not do any activities that will make you sweat heavily until the skin glue has fallen off on its own.  Do not apply liquid, cream, or ointment medicine to the wound while the skin glue is in place. Using those may loosen the film before the wound has healed.  If you were given a bandage (dressing), you should change it at least one time per day or as told by your health care provider. You should also change it if it becomes dirty or wet.  If a dressing is placed over the wound, be careful not to apply tape directly over the skin glue. Doing that may cause the glue to  be pulled off before the wound has healed.  Do not pick at the glue. The skin glue usually remains in place for 5-10 days, then it falls off of the skin. General Instructions  Take over-the-counter and prescription medicines only as told by your health care provider.  If you were prescribed an antibiotic medicine or ointment, take or apply it as told by your doctor. Do not stop using it even if your condition improves.  To help prevent scarring, make sure to cover your wound with sunscreen whenever you are outside after stitches are removed, after adhesive strips are removed, or when glue remains in place and the wound is healed. Make sure to wear a sunscreen of at least 30 SPF.  Do not scratch or pick at the wound.  Keep all follow-up visits as told by your  health care provider. This is important.  Check your wound every day for signs of infection. Watch for:  Redness, swelling, or pain.  Fluid, blood, or pus.  Raise (elevate) the injured area above the level of your heart while you are sitting or lying down, if possible. SEEK MEDICAL CARE IF:  You received a tetanus shot and you have swelling, severe pain, redness, or bleeding at the injection site.  You have a fever.  A wound that was closed breaks open.  You notice a bad smell coming from your wound or your dressing.  You notice something coming out of the wound, such as wood or glass.  Your pain is not controlled with medicine.  You have increased redness, swelling, or pain at the site of your wound.  You have fluid, blood, or pus coming from your wound.  You notice a change in the color of your skin near your wound.  You need to change the dressing frequently due to fluid, blood, or pus draining from the wound.  You develop a new rash.  You develop numbness around the wound. SEEK IMMEDIATE MEDICAL CARE IF:  You develop severe swelling around the wound.  Your pain suddenly increases and is severe.  You develop painful lumps near the wound or on skin that is anywhere on your body.  You have a red streak going away from your wound.  The wound is on your hand or foot and you cannot properly move a finger or toe.  The wound is on your hand or foot and you notice that your fingers or toes look pale or bluish.   This information is not intended to replace advice given to you by your health care provider. Make sure you discuss any questions you have with your health care provider.   Document Released: 07/18/2005 Document Revised: 12/02/2014 Document Reviewed: 07/14/2014 Elsevier Interactive Patient Education 2016 [place], [place], or Adhesive Wound Closure Health care providers use stitches (sutures), staples, and certain glue (skin adhesives) to hold  skin together while it heals (wound closure). You may need this treatment after you have surgery or if you cut your skin accidentally. These methods help your skin to heal more quickly and make it less likely that you will have a scar. A wound may take several months to heal completely. The type of wound you have determines when your wound gets closed. In most cases, the wound is closed as soon as possible (primary skin closure). Sometimes, closure is delayed so the wound can be cleaned and allowed to heal naturally. This reduces the chance of infection. Delayed closure may be needed if your wound:  Is caused by a bite.  Happened more than 6 hours ago.  Involves loss of skin or the tissues under the skin.  Has dirt or debris in it that cannot be removed.  Is infected. WHAT ARE THE DIFFERENT KINDS OF WOUND CLOSURES? There are many options for wound closure. The one that your health care provider uses depends on how deep and how large your wound is. Adhesive Glue To use this type of glue to close a wound, your health care provider holds the edges of the wound together and paints the glue on the surface of your skin. You may need more than one layer of glue. Then the wound may be covered with a light bandage (dressing). This type of skin closure may be used for small wounds that are not deep (superficial). Using glue for wound closure is less painful than other methods. It does not require a medicine that numbs the area (local anesthetic). This method also leaves nothing to be removed. Adhesive glue is often used for children and on facial wounds. Adhesive glue cannot be used for wounds that are deep, uneven, or bleeding. It is not used inside of a wound.  Adhesive Strips These strips are made of sticky (adhesive), porous paper. They are applied across your skin edges like a regular adhesive bandage. You leave them on until they fall off. Adhesive strips may be used to close very superficial wounds.  They may also be used along with sutures to improve the closure of your skin edges.  Sutures Sutures are the oldest method of wound closure. Sutures can be made from natural substances, such as silk, or from synthetic materials, such as nylon and steel. They can be made from a material that your body can break down as your wound heals (absorbable), or they can be made from a material that needs to be removed from your skin (nonabsorbable). They come in many different strengths and sizes. Your health care provider attaches the sutures to a steel needle on one end. Sutures can be passed through your skin, or through the tissues beneath your skin. Then they are tied and cut. Your skin edges may be closed in one continuous stitch or in separate stitches. Sutures are strong and can be used for all kinds of wounds. Absorbable sutures may be used to close tissues under the skin. The disadvantage of sutures is that they may cause skin reactions that lead to infection. Nonabsorbable sutures need to be removed. Staples When surgical staples are used to close a wound, the edges of your skin on both sides of the wound are brought close together. A staple is placed across the wound, and an instrument secures the edges together. Staples are often used to close surgical cuts (incisions). Staples are faster to use than sutures, and they cause less skin reaction. Staples need to be removed using a tool that bends the staples away from your skin. HOW DO I CARE FOR MY WOUND CLOSURE?  Take medicines only as directed by your health care provider.  If you were prescribed an antibiotic medicine for your wound, finish it all even if you start to feel better.  Use ointments or creams only as directed by your health care provider.  Wash your hands with soap and water before and after touching your wound.  Do not soak your wound in water. Do not take baths, swim, or use a hot tub until your health care provider  approves.  Ask your health care provider  when you can start showering. Cover your wound if directed by your health care provider.  Do not take out your own sutures or staples.  Do not pick at your wound. Picking can cause an infection.  Keep all follow-up visits as directed by your health care provider. This is important. HOW LONG WILL I HAVE MY WOUND CLOSURE?  Leave adhesive glue on your skin until the glue peels away.  Leave adhesive strips on your skin until the strips fall off.  Absorbable sutures will dissolve within several days.  Nonabsorbable sutures and staples must be removed. The location of the wound will determine how long they stay in. This can range from several days to a couple of weeks. WHEN SHOULD I SEEK HELP FOR MY WOUND CLOSURE? Contact your health care provider if:  You have a fever.  You have chills.  You have drainage, redness, swelling, or pain at your wound.  There is a bad smell coming from your wound.  The skin edges of your wound start to separate after your sutures have been removed.  Your wound becomes thick, raised, and darker in color after your sutures come out (scarring).   This information is not intended to replace advice given to you by your health care provider. Make sure you discuss any questions you have with your health care provider.   Document Released: 04/12/2001 Document Revised: 08/08/2014 Document Reviewed: 12/25/2013 Elsevier Interactive Patient Education [employer].

## 2015-11-05 NOTE — ED Notes (Signed)
Per pt, states bed rail fell on head-laceration to back , top of head, bleeding controlled-no LOC

## 2015-11-16 DIAGNOSIS — Z4802 Encounter for removal of sutures: Secondary | ICD-10-CM | POA: Diagnosis not present

## 2015-11-16 DIAGNOSIS — Z20828 Contact with and (suspected) exposure to other viral communicable diseases: Secondary | ICD-10-CM | POA: Diagnosis not present

## 2015-12-09 DIAGNOSIS — M109 Gout, unspecified: Secondary | ICD-10-CM | POA: Diagnosis not present

## 2015-12-09 DIAGNOSIS — D3500 Benign neoplasm of unspecified adrenal gland: Secondary | ICD-10-CM | POA: Diagnosis not present

## 2015-12-09 DIAGNOSIS — Z0001 Encounter for general adult medical examination with abnormal findings: Secondary | ICD-10-CM | POA: Diagnosis not present

## 2015-12-09 DIAGNOSIS — E559 Vitamin D deficiency, unspecified: Secondary | ICD-10-CM | POA: Diagnosis not present

## 2015-12-09 DIAGNOSIS — I10 Essential (primary) hypertension: Secondary | ICD-10-CM | POA: Diagnosis not present

## 2015-12-09 DIAGNOSIS — J309 Allergic rhinitis, unspecified: Secondary | ICD-10-CM | POA: Diagnosis not present

## 2015-12-09 DIAGNOSIS — Z79899 Other long term (current) drug therapy: Secondary | ICD-10-CM | POA: Diagnosis not present

## 2015-12-09 DIAGNOSIS — H9193 Unspecified hearing loss, bilateral: Secondary | ICD-10-CM | POA: Diagnosis not present

## 2015-12-09 DIAGNOSIS — Z125 Encounter for screening for malignant neoplasm of prostate: Secondary | ICD-10-CM | POA: Diagnosis not present

## 2016-05-10 DIAGNOSIS — Z23 Encounter for immunization: Secondary | ICD-10-CM | POA: Diagnosis not present

## 2016-05-16 DIAGNOSIS — H35371 Puckering of macula, right eye: Secondary | ICD-10-CM | POA: Diagnosis not present

## 2016-05-16 DIAGNOSIS — H2513 Age-related nuclear cataract, bilateral: Secondary | ICD-10-CM | POA: Diagnosis not present

## 2016-05-16 DIAGNOSIS — H35372 Puckering of macula, left eye: Secondary | ICD-10-CM | POA: Diagnosis not present

## 2016-05-26 DIAGNOSIS — H35371 Puckering of macula, right eye: Secondary | ICD-10-CM | POA: Diagnosis not present

## 2016-05-26 DIAGNOSIS — H35463 Secondary vitreoretinal degeneration, bilateral: Secondary | ICD-10-CM | POA: Diagnosis not present

## 2016-05-26 DIAGNOSIS — H35341 Macular cyst, hole, or pseudohole, right eye: Secondary | ICD-10-CM | POA: Diagnosis not present

## 2016-05-26 DIAGNOSIS — H35423 Microcystoid degeneration of retina, bilateral: Secondary | ICD-10-CM | POA: Diagnosis not present

## 2016-10-14 DIAGNOSIS — I10 Essential (primary) hypertension: Secondary | ICD-10-CM | POA: Diagnosis not present

## 2016-11-24 DIAGNOSIS — I10 Essential (primary) hypertension: Secondary | ICD-10-CM | POA: Diagnosis not present

## 2016-11-24 DIAGNOSIS — H35463 Secondary vitreoretinal degeneration, bilateral: Secondary | ICD-10-CM | POA: Diagnosis not present

## 2016-11-24 DIAGNOSIS — H35371 Puckering of macula, right eye: Secondary | ICD-10-CM | POA: Diagnosis not present

## 2016-11-24 DIAGNOSIS — H35423 Microcystoid degeneration of retina, bilateral: Secondary | ICD-10-CM | POA: Diagnosis not present

## 2016-11-24 DIAGNOSIS — H35341 Macular cyst, hole, or pseudohole, right eye: Secondary | ICD-10-CM | POA: Diagnosis not present

## 2017-03-02 DIAGNOSIS — I10 Essential (primary) hypertension: Secondary | ICD-10-CM | POA: Diagnosis not present

## 2017-03-02 DIAGNOSIS — Z79899 Other long term (current) drug therapy: Secondary | ICD-10-CM | POA: Diagnosis not present

## 2017-03-02 DIAGNOSIS — H9193 Unspecified hearing loss, bilateral: Secondary | ICD-10-CM | POA: Diagnosis not present

## 2017-03-02 DIAGNOSIS — Z0001 Encounter for general adult medical examination with abnormal findings: Secondary | ICD-10-CM | POA: Diagnosis not present

## 2017-03-02 DIAGNOSIS — J309 Allergic rhinitis, unspecified: Secondary | ICD-10-CM | POA: Diagnosis not present

## 2017-03-02 DIAGNOSIS — E559 Vitamin D deficiency, unspecified: Secondary | ICD-10-CM | POA: Diagnosis not present

## 2017-03-02 DIAGNOSIS — Z125 Encounter for screening for malignant neoplasm of prostate: Secondary | ICD-10-CM | POA: Diagnosis not present

## 2017-03-02 DIAGNOSIS — M109 Gout, unspecified: Secondary | ICD-10-CM | POA: Diagnosis not present

## 2017-03-02 DIAGNOSIS — Z1389 Encounter for screening for other disorder: Secondary | ICD-10-CM | POA: Diagnosis not present

## 2017-03-02 DIAGNOSIS — H5461 Unqualified visual loss, right eye, normal vision left eye: Secondary | ICD-10-CM | POA: Diagnosis not present

## 2017-06-01 DIAGNOSIS — H2513 Age-related nuclear cataract, bilateral: Secondary | ICD-10-CM | POA: Diagnosis not present

## 2017-06-01 DIAGNOSIS — H35371 Puckering of macula, right eye: Secondary | ICD-10-CM | POA: Diagnosis not present

## 2017-06-01 DIAGNOSIS — H524 Presbyopia: Secondary | ICD-10-CM | POA: Diagnosis not present

## 2017-06-27 DIAGNOSIS — J3089 Other allergic rhinitis: Secondary | ICD-10-CM | POA: Diagnosis not present

## 2017-06-27 DIAGNOSIS — J301 Allergic rhinitis due to pollen: Secondary | ICD-10-CM | POA: Diagnosis not present

## 2017-06-27 DIAGNOSIS — J3081 Allergic rhinitis due to animal (cat) (dog) hair and dander: Secondary | ICD-10-CM | POA: Diagnosis not present

## 2017-07-14 DIAGNOSIS — J301 Allergic rhinitis due to pollen: Secondary | ICD-10-CM | POA: Diagnosis not present

## 2017-07-14 DIAGNOSIS — J3089 Other allergic rhinitis: Secondary | ICD-10-CM | POA: Diagnosis not present

## 2017-07-14 DIAGNOSIS — J3081 Allergic rhinitis due to animal (cat) (dog) hair and dander: Secondary | ICD-10-CM | POA: Diagnosis not present

## 2018-05-24 DIAGNOSIS — Z23 Encounter for immunization: Secondary | ICD-10-CM | POA: Diagnosis not present

## 2018-05-29 DIAGNOSIS — Z0000 Encounter for general adult medical examination without abnormal findings: Secondary | ICD-10-CM | POA: Diagnosis not present

## 2018-05-29 DIAGNOSIS — J309 Allergic rhinitis, unspecified: Secondary | ICD-10-CM | POA: Diagnosis not present

## 2018-05-29 DIAGNOSIS — H5461 Unqualified visual loss, right eye, normal vision left eye: Secondary | ICD-10-CM | POA: Diagnosis not present

## 2018-05-29 DIAGNOSIS — H9193 Unspecified hearing loss, bilateral: Secondary | ICD-10-CM | POA: Diagnosis not present

## 2018-05-29 DIAGNOSIS — Z125 Encounter for screening for malignant neoplasm of prostate: Secondary | ICD-10-CM | POA: Diagnosis not present

## 2018-05-29 DIAGNOSIS — M109 Gout, unspecified: Secondary | ICD-10-CM | POA: Diagnosis not present

## 2018-05-29 DIAGNOSIS — E559 Vitamin D deficiency, unspecified: Secondary | ICD-10-CM | POA: Diagnosis not present

## 2018-05-29 DIAGNOSIS — Z79899 Other long term (current) drug therapy: Secondary | ICD-10-CM | POA: Diagnosis not present

## 2018-05-29 DIAGNOSIS — Z1389 Encounter for screening for other disorder: Secondary | ICD-10-CM | POA: Diagnosis not present

## 2018-05-29 DIAGNOSIS — I10 Essential (primary) hypertension: Secondary | ICD-10-CM | POA: Diagnosis not present

## 2018-08-20 DIAGNOSIS — N5082 Scrotal pain: Secondary | ICD-10-CM | POA: Diagnosis not present

## 2018-08-20 DIAGNOSIS — N5089 Other specified disorders of the male genital organs: Secondary | ICD-10-CM | POA: Diagnosis not present

## 2018-08-28 DIAGNOSIS — H2513 Age-related nuclear cataract, bilateral: Secondary | ICD-10-CM | POA: Diagnosis not present

## 2018-08-28 DIAGNOSIS — H524 Presbyopia: Secondary | ICD-10-CM | POA: Diagnosis not present

## 2018-08-28 DIAGNOSIS — H52203 Unspecified astigmatism, bilateral: Secondary | ICD-10-CM | POA: Diagnosis not present

## 2018-08-28 DIAGNOSIS — H35371 Puckering of macula, right eye: Secondary | ICD-10-CM | POA: Diagnosis not present

## 2018-09-24 DIAGNOSIS — N4340 Spermatocele of epididymis, unspecified: Secondary | ICD-10-CM | POA: Diagnosis not present

## 2018-09-24 DIAGNOSIS — R3129 Other microscopic hematuria: Secondary | ICD-10-CM | POA: Diagnosis not present

## 2019-04-02 DIAGNOSIS — Z20828 Contact with and (suspected) exposure to other viral communicable diseases: Secondary | ICD-10-CM | POA: Diagnosis not present

## 2019-04-30 DIAGNOSIS — Z23 Encounter for immunization: Secondary | ICD-10-CM | POA: Diagnosis not present

## 2019-05-30 DIAGNOSIS — Z1159 Encounter for screening for other viral diseases: Secondary | ICD-10-CM | POA: Diagnosis not present

## 2019-06-04 DIAGNOSIS — K635 Polyp of colon: Secondary | ICD-10-CM | POA: Diagnosis not present

## 2019-06-04 DIAGNOSIS — D123 Benign neoplasm of transverse colon: Secondary | ICD-10-CM | POA: Diagnosis not present

## 2019-06-04 DIAGNOSIS — K5730 Diverticulosis of large intestine without perforation or abscess without bleeding: Secondary | ICD-10-CM | POA: Diagnosis not present

## 2019-06-04 DIAGNOSIS — Z86010 Personal history of colonic polyps: Secondary | ICD-10-CM | POA: Diagnosis not present

## 2019-06-07 DIAGNOSIS — D123 Benign neoplasm of transverse colon: Secondary | ICD-10-CM | POA: Diagnosis not present

## 2019-06-07 DIAGNOSIS — K635 Polyp of colon: Secondary | ICD-10-CM | POA: Diagnosis not present

## 2019-06-12 DIAGNOSIS — N430 Encysted hydrocele: Secondary | ICD-10-CM | POA: Diagnosis not present

## 2019-06-25 DIAGNOSIS — N430 Encysted hydrocele: Secondary | ICD-10-CM | POA: Diagnosis not present

## 2019-08-23 ENCOUNTER — Ambulatory Visit: Payer: PRIVATE HEALTH INSURANCE | Attending: Internal Medicine

## 2019-08-23 DIAGNOSIS — Z23 Encounter for immunization: Secondary | ICD-10-CM | POA: Insufficient documentation

## 2019-08-23 NOTE — Progress Notes (Signed)
   Covid-19 Vaccination Clinic  Name:  [name]    MRN: [id number] DOB: Sep 07, 1950  08/23/2019  Mr. Rise was observed post Covid-19 immunization for 15 minutes without incidence. He was provided with Vaccine Information Sheet and instruction to access the V-Safe system.   [name] was instructed to call 911 with any severe reactions post vaccine: Marland Kitchen Difficulty breathing  . Swelling of your face and throat  . A fast heartbeat  . A bad rash all over your body  . Dizziness and weakness    Immunizations Administered    Name Date Dose VIS Date Route   Pfizer COVID-19 Vaccine 08/23/2019  2:40 PM 0.3 mL 07/12/2019 Intramuscular   Manufacturer: [place]   Lot: [id number]   [place]: [id number]

## 2019-09-12 ENCOUNTER — Ambulatory Visit: Payer: PRIVATE HEALTH INSURANCE | Attending: Internal Medicine

## 2019-09-12 DIAGNOSIS — Z23 Encounter for immunization: Secondary | ICD-10-CM | POA: Insufficient documentation

## 2019-09-12 NOTE — Progress Notes (Signed)
   Covid-19 Vaccination Clinic  Name:  [name]    MRN: [id number] DOB: Nov 30, 1950  09/12/2019  [name] was observed post Covid-19 immunization for 15 minutes without incidence. He was provided with Vaccine Information Sheet and instruction to access the V-Safe system.   [name] was instructed to call 911 with any severe reactions post vaccine: Marland Kitchen Difficulty breathing  . Swelling of your face and throat  . A fast heartbeat  . A bad rash all over your body  . Dizziness and weakness    Immunizations Administered    Name Date Dose VIS Date Route   Pfizer COVID-19 Vaccine 09/12/2019  2:08 PM 0.3 mL 07/12/2019 Intramuscular   Manufacturer: [place]   Lot: [id number]   [place]: [id number]

## 2020-03-03 ENCOUNTER — Other Ambulatory Visit: Payer: Self-pay | Admitting: Urology

## 2020-04-27 ENCOUNTER — Other Ambulatory Visit: Payer: Self-pay

## 2020-04-27 ENCOUNTER — Encounter (HOSPITAL_BASED_OUTPATIENT_CLINIC_OR_DEPARTMENT_OTHER): Payer: Self-pay | Admitting: Urology

## 2020-04-27 NOTE — Progress Notes (Signed)
Spoke w/ via phone for pre-op interview---pt Lab needs dos----  I stat 8, ekg             Lab results------none COVID test ------04-28-20 1100 Arrive at -------1045 am 04-30-20 NPO after MN NO Solid Food.  Clear liquids from MN until---945 am then npo Medications to take morning of surgery -----allopurinol Diabetic medication -----n/a Patient Special Instructions -----none Pre-Op special Istructions -----none Patient verbalized understanding of instructions that were given at this phone interview. Patient denies shortness of breath, chest pain, fever, cough at this phone interview.

## 2020-04-28 ENCOUNTER — Other Ambulatory Visit (HOSPITAL_COMMUNITY)
Admission: RE | Admit: 2020-04-28 | Discharge: 2020-04-28 | Disposition: A | Discharge status: Home / Self Care | Payer: Medicare Other | Source: Ambulatory Visit | Attending: Urology | Admitting: Urology

## 2020-04-28 DIAGNOSIS — Z01812 Encounter for preprocedural laboratory examination: Secondary | ICD-10-CM | POA: Diagnosis present

## 2020-04-28 DIAGNOSIS — Z20822 Contact with and (suspected) exposure to covid-19: Secondary | ICD-10-CM | POA: Diagnosis not present

## 2020-04-28 LAB — SARS CORONAVIRUS 2 (TAT 6-24 HRS): SARS Coronavirus 2: NEGATIVE

## 2020-04-29 NOTE — H&P (Signed)
H&P  Chief Complaint: Rt scrotal swelling  History of Present Illness: 69 yo male w/ symptomatic  Rt hydrocele presents for surgical mgmt  Past Medical History:  Diagnosis Date  . 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 Concussion    x 2 yrs ago no residual  . Gout   . Hypertension   . Pneumonia yrs ago  . Right hydrocele     Past Surgical History:  Procedure Laterality Date  . COLONOSCOPY     Hx: of  . ESOPHAGOGASTRODUODENOSCOPY N/A 10/24/2012   Procedure: ESOPHAGOGASTRODUODENOSCOPY (EGD);  Surgeon: Arta Silence, MD;  Location: [facility];  Service: Endoscopy;  Laterality: N/A;  . EUS N/A 10/24/2012   Procedure: ESOPHAGEAL ENDOSCOPIC ULTRASOUND (EUS) RADIAL;  Surgeon: Arta Silence, MD;  Location: [facility];  Service: Endoscopy;  Laterality: N/A;  + or - FNA  . FINE NEEDLE ASPIRATION N/A 10/24/2012   Procedure: FINE NEEDLE ASPIRATION (FNA) LINEAR;  Surgeon: Arta Silence, MD;  Location: [facility];  Service: Endoscopy;  Laterality: N/A;  . [place]   right  . LAPAROSCOPIC LIVER ULTRASOUND N/A 01/10/2013   Procedure: LAPAROSCOPIC LIVER ULTRASOUND;  Surgeon: Gayland Curry, MD;  Location: [place];  Service: General;  Laterality: N/A;  . LAPAROSCOPY N/A 01/10/2013   Procedure: LAPAROSCOPY DIAGNOSTIC;  Surgeon: Gayland Curry, MD;  Location: [place];  Service: General;  Laterality: N/A;  . RETROPERITONEAL MASS EXCISION  01/10/2013  . TONSILLECTOMY  as child    Home Medications:  Allergies as of 04/29/2020      Reactions   Sulfa Antibiotics Itching      Medication List    Notice   Cannot display discharge medications because the patient has not yet been admitted.     Allergies:  Allergies  Allergen Reactions  . Sulfa Antibiotics Itching    Family History  Problem Relation Age of Onset  . Heart disease Father     Social History:  reports that he has never smoked. He has never used smokeless tobacco. He reports  current alcohol use. He reports that he does not use drugs.  ROS: A complete review of systems was performed.  All systems are negative except for pertinent findings as noted.  Physical Exam:  Vital signs in last 24 hours: Ht 5\' 10"  (1.778 m)   Wt 79.4 kg   BMI 25.11 kg/m  Constitutional:  Alert and oriented, No acute distress Cardiovascular: Regular rate  Respiratory: Normal respiratory effort GI: Abdomen is soft, nontender, nondistended, no abdominal masses. No CVAT.  Genitourinary: Rt hydrocele Lymphatic: No lymphadenopathy Neurologic: Grossly intact, no focal deficits Psychiatric: Normal mood and affect  Laboratory Data:  No results for input(s): WBC, HGB, HCT, PLT in the last 72 hours.  No results for input(s): NA, K, CL, GLUCOSE, BUN, CALCIUM, CREATININE in the last 72 hours.  Invalid input(s): CO3   No results found for this or any previous visit (from the past 24 hour(s)). Recent Results (from the past 240 hour(s))  SARS CORONAVIRUS 2 (TAT 6-24 HRS) Nasopharyngeal Nasopharyngeal Swab     Status: None   Collection Time: 04/28/20 10:28 AM   Specimen: Nasopharyngeal Swab  Result Value Ref Range Status   SARS Coronavirus 2 NEGATIVE NEGATIVE Final    Comment: (NOTE) SARS-CoV-2 target nucleic acids are NOT DETECTED.  The SARS-CoV-2 RNA is generally detectable in upper and lower respiratory specimens during the acute phase of infection. Negative results do not preclude SARS-CoV-2  infection, do not rule out co-infections with other pathogens, and should not be used as the sole basis for treatment or other patient management decisions. Negative results must be combined with clinical observations, patient history, and epidemiological information. The expected result is Negative.  Fact Sheet for Patients: SugarRoll.be  Fact Sheet for Healthcare Providers: https://www.woods-mathews.com/  This test is not yet approved or cleared  by the [place] FDA and  has been authorized for detection and/or diagnosis of SARS-CoV-2 by FDA under an Emergency Use Authorization (EUA). This EUA will remain  in effect (meaning this test can be used) for the duration of the COVID-19 declaration under Se ction 564(b)(1) of the Act, 21 U.S.C. section 360bbb-3(b)(1), unless the authorization is terminated or revoked sooner.  Performed at [facility], [place] [address] [place], [place] [address]     Renal Function: No results for input(s): CREATININE in the last 168 hours. CrCl cannot be calculated (Patient's most recent lab result is older than the maximum 21 days allowed.).  Radiologic Imaging: No results found.  Impression/Assessment:  Rt hydrocele  Plan:  Rt hydrocelectomy

## 2020-04-30 ENCOUNTER — Other Ambulatory Visit: Payer: Self-pay

## 2020-04-30 ENCOUNTER — Ambulatory Visit (HOSPITAL_BASED_OUTPATIENT_CLINIC_OR_DEPARTMENT_OTHER): Payer: Medicare Other | Admitting: Anesthesiology

## 2020-04-30 ENCOUNTER — Encounter (HOSPITAL_BASED_OUTPATIENT_CLINIC_OR_DEPARTMENT_OTHER)
Admission: RE | Disposition: A | Discharge status: Home / Self Care | Payer: Self-pay | Source: Home / Self Care | Attending: Urology

## 2020-04-30 ENCOUNTER — Ambulatory Visit (HOSPITAL_BASED_OUTPATIENT_CLINIC_OR_DEPARTMENT_OTHER)
Admission: RE | Admit: 2020-04-30 | Discharge: 2020-04-30 | Disposition: A | Discharge status: Home / Self Care | Payer: Medicare Other | Attending: Urology | Admitting: Urology

## 2020-04-30 ENCOUNTER — Encounter (HOSPITAL_BASED_OUTPATIENT_CLINIC_OR_DEPARTMENT_OTHER): Payer: Self-pay | Admitting: Urology

## 2020-04-30 DIAGNOSIS — N433 Hydrocele, unspecified: Secondary | ICD-10-CM | POA: Insufficient documentation

## 2020-04-30 DIAGNOSIS — M109 Gout, unspecified: Secondary | ICD-10-CM | POA: Insufficient documentation

## 2020-04-30 DIAGNOSIS — Z882 Allergy status to sulfonamides status: Secondary | ICD-10-CM | POA: Insufficient documentation

## 2020-04-30 DIAGNOSIS — I10 Essential (primary) hypertension: Secondary | ICD-10-CM | POA: Insufficient documentation

## 2020-04-30 DIAGNOSIS — Z8249 Family history of ischemic heart disease and other diseases of the circulatory system: Secondary | ICD-10-CM | POA: Insufficient documentation

## 2020-04-30 DIAGNOSIS — Z8589 Personal history of malignant neoplasm of other organs and systems: Secondary | ICD-10-CM | POA: Diagnosis not present

## 2020-04-30 DIAGNOSIS — N432 Other hydrocele: Secondary | ICD-10-CM

## 2020-04-30 HISTORY — PX: HYDROCELE EXCISION: SHX482

## 2020-04-30 HISTORY — DX: Hydrocele, unspecified: N43.3

## 2020-04-30 HISTORY — DX: Concussion with loss of consciousness of unspecified duration, initial encounter: S06.0X9A

## 2020-04-30 HISTORY — DX: Concussion with loss of consciousness status unknown, initial encounter: S06.0XAA

## 2020-04-30 LAB — POCT I-STAT, CHEM 8
BUN: 31 mg/dL — ABNORMAL HIGH (ref 8–23)
Calcium, Ion: 1.26 mmol/L (ref 1.15–1.40)
Chloride: 99 mmol/L (ref 98–111)
Creatinine, Ser: 1.5 mg/dL — ABNORMAL HIGH (ref 0.61–1.24)
Glucose, Bld: 101 mg/dL — ABNORMAL HIGH (ref 70–99)
HCT: 45 % (ref 39.0–52.0)
Hemoglobin: 15.3 g/dL (ref 13.0–17.0)
Potassium: 3.8 mmol/L (ref 3.5–5.1)
Sodium: 138 mmol/L (ref 135–145)
TCO2: 25 mmol/L (ref 22–32)

## 2020-04-30 SURGERY — HYDROCELECTOMY
Anesthesia: General | Site: Scrotum | Laterality: Right

## 2020-04-30 MED ORDER — KETOROLAC TROMETHAMINE 30 MG/ML IJ SOLN
INTRAMUSCULAR | Status: AC
  Filled 2020-04-30: qty 1

## 2020-04-30 MED ORDER — FENTANYL CITRATE (PF) 100 MCG/2ML IJ SOLN
INTRAMUSCULAR | Status: AC
  Filled 2020-04-30: qty 2

## 2020-04-30 MED ORDER — ONDANSETRON HCL 4 MG/2ML IJ SOLN: 4.0000 mg | Freq: Once | INTRAMUSCULAR | Status: DC | PRN

## 2020-04-30 MED ORDER — LACTATED RINGERS IV SOLN: INTRAVENOUS | Status: DC

## 2020-04-30 MED ORDER — LIDOCAINE 2% (20 MG/ML) 5 ML SYRINGE
INTRAMUSCULAR | Status: DC | PRN
  Administered 2020-04-30: 60 mg via INTRAVENOUS

## 2020-04-30 MED ORDER — FENTANYL CITRATE (PF) 100 MCG/2ML IJ SOLN
INTRAMUSCULAR | Status: DC | PRN
Start: 2020-04-30 — End: 2020-04-30
  Administered 2020-04-30: 50 ug via INTRAVENOUS

## 2020-04-30 MED ORDER — FENTANYL CITRATE (PF) 100 MCG/2ML IJ SOLN: 25.0000 ug | INTRAMUSCULAR | Status: DC | PRN

## 2020-04-30 MED ORDER — CEPHALEXIN 500 MG PO CAPS: 500.0000 mg | ORAL_CAPSULE | Freq: Two times a day (BID) | ORAL | 0 refills | Status: AC

## 2020-04-30 MED ORDER — ONDANSETRON HCL 4 MG/2ML IJ SOLN
INTRAMUSCULAR | Status: AC
  Filled 2020-04-30: qty 2

## 2020-04-30 MED ORDER — CEFAZOLIN SODIUM-DEXTROSE 2-4 GM/100ML-% IV SOLN
2.0000 g | INTRAVENOUS | Status: AC
  Administered 2020-04-30: 2 g via INTRAVENOUS

## 2020-04-30 MED ORDER — PHENYLEPHRINE 40 MCG/ML (10ML) SYRINGE FOR IV PUSH (FOR BLOOD PRESSURE SUPPORT)
PREFILLED_SYRINGE | INTRAVENOUS | Status: AC
  Filled 2020-04-30: qty 10

## 2020-04-30 MED ORDER — DEXAMETHASONE SODIUM PHOSPHATE 10 MG/ML IJ SOLN
INTRAMUSCULAR | Status: AC
  Filled 2020-04-30: qty 1

## 2020-04-30 MED ORDER — OXYCODONE HCL 5 MG PO TABS: 5.0000 mg | ORAL_TABLET | Freq: Once | ORAL | Status: DC | PRN

## 2020-04-30 MED ORDER — PROPOFOL 10 MG/ML IV BOLUS
INTRAVENOUS | Status: DC | PRN
  Administered 2020-04-30: 150 mg via INTRAVENOUS
  Administered 2020-04-30: 50 mg via INTRAVENOUS

## 2020-04-30 MED ORDER — DEXAMETHASONE SODIUM PHOSPHATE 10 MG/ML IJ SOLN
INTRAMUSCULAR | Status: DC | PRN
  Administered 2020-04-30: 10 mg via INTRAVENOUS

## 2020-04-30 MED ORDER — CEFAZOLIN SODIUM-DEXTROSE 2-4 GM/100ML-% IV SOLN
INTRAVENOUS | Status: AC
  Filled 2020-04-30: qty 100

## 2020-04-30 MED ORDER — ONDANSETRON HCL 4 MG/2ML IJ SOLN
INTRAMUSCULAR | Status: DC | PRN
  Administered 2020-04-30: 4 mg via INTRAVENOUS

## 2020-04-30 MED ORDER — BUPIVACAINE HCL (PF) 0.25 % IJ SOLN
INTRAMUSCULAR | Status: DC | PRN
  Administered 2020-04-30: 10 mL

## 2020-04-30 MED ORDER — OXYCODONE HCL 5 MG/5ML PO SOLN: 5.0000 mg | Freq: Once | ORAL | Status: DC | PRN

## 2020-04-30 MED ORDER — TRAMADOL HCL 50 MG PO TABS: 50.0000 mg | ORAL_TABLET | Freq: Four times a day (QID) | ORAL | 0 refills | Status: DC | PRN

## 2020-04-30 MED ORDER — PROPOFOL 10 MG/ML IV BOLUS
INTRAVENOUS | Status: AC
  Filled 2020-04-30: qty 20

## 2020-04-30 MED ORDER — EPHEDRINE 5 MG/ML INJ
INTRAVENOUS | Status: AC
  Filled 2020-04-30: qty 10

## 2020-04-30 MED ORDER — EPHEDRINE SULFATE-NACL 50-0.9 MG/10ML-% IV SOSY
PREFILLED_SYRINGE | INTRAVENOUS | Status: DC | PRN
  Administered 2020-04-30: 15 mg via INTRAVENOUS

## 2020-04-30 MED ORDER — PHENYLEPHRINE 40 MCG/ML (10ML) SYRINGE FOR IV PUSH (FOR BLOOD PRESSURE SUPPORT)
PREFILLED_SYRINGE | INTRAVENOUS | Status: DC | PRN
  Administered 2020-04-30 (×2): 120 ug via INTRAVENOUS

## 2020-04-30 SURGICAL SUPPLY — 42 items
ADH SKN CLS APL DERMABOND .7 (GAUZE/BANDAGES/DRESSINGS) ×1
BLADE CLIPPER SENSICLIP SURGIC (BLADE) ×3 IMPLANT
BLADE SURG 15 STRL LF DISP TIS (BLADE) ×1 IMPLANT
BLADE SURG 15 STRL SS (BLADE) ×3
BNDG GAUZE ELAST 4 BULKY (GAUZE/BANDAGES/DRESSINGS) ×3 IMPLANT
CANISTER SUCT 3000ML PPV (MISCELLANEOUS) ×3 IMPLANT
CLEANER CAUTERY TIP 5X5 PAD (MISCELLANEOUS) ×1 IMPLANT
COVER BACK TABLE 60X90IN (DRAPES) ×3 IMPLANT
COVER MAYO STAND STRL (DRAPES) ×3 IMPLANT
COVER WAND RF STERILE (DRAPES) ×3 IMPLANT
DERMABOND ADVANCED (GAUZE/BANDAGES/DRESSINGS) ×2
DERMABOND ADVANCED .7 DNX12 (GAUZE/BANDAGES/DRESSINGS) ×1 IMPLANT
DRAIN PENROSE 0.25X18 (DRAIN) ×3 IMPLANT
DRAPE LAPAROTOMY 100X72 PEDS (DRAPES) ×3 IMPLANT
ELECT NEEDLE TIP 2.8 STRL (NEEDLE) ×3 IMPLANT
ELECT REM PT RETURN 9FT ADLT (ELECTROSURGICAL) ×3
ELECTRODE REM PT RTRN 9FT ADLT (ELECTROSURGICAL) ×1 IMPLANT
GLOVE BIO SURGEON STRL SZ7 (GLOVE) ×3 IMPLANT
GLOVE BIO SURGEON STRL SZ8 (GLOVE) ×3 IMPLANT
GLOVE BIOGEL PI IND STRL 7.5 (GLOVE) ×1 IMPLANT
GLOVE BIOGEL PI INDICATOR 7.5 (GLOVE) ×2
GOWN STRL REUS W/ TWL LRG LVL3 (GOWN DISPOSABLE) ×2 IMPLANT
GOWN STRL REUS W/ TWL XL LVL3 (GOWN DISPOSABLE) ×1 IMPLANT
GOWN STRL REUS W/TWL LRG LVL3 (GOWN DISPOSABLE) ×6
GOWN STRL REUS W/TWL XL LVL3 (GOWN DISPOSABLE) ×3
KIT TURNOVER CYSTO (KITS) ×3 IMPLANT
NEEDLE HYPO 22GX1.5 SAFETY (NEEDLE) ×3 IMPLANT
NS IRRIG 500ML POUR BTL (IV SOLUTION) ×3 IMPLANT
PACK BASIN DAY SURGERY FS (CUSTOM PROCEDURE TRAY) ×3 IMPLANT
PAD CLEANER CAUTERY TIP 5X5 (MISCELLANEOUS) ×2
PENCIL SMOKE EVACUATOR (MISCELLANEOUS) ×3 IMPLANT
SUPPORT SCROTAL LG STRP (MISCELLANEOUS) ×2 IMPLANT
SUPPORTER ATHLETIC LG (MISCELLANEOUS) ×1
SUT CHROMIC 2 0 SH (SUTURE) ×3 IMPLANT
SUT MNCRL AB 4-0 PS2 18 (SUTURE) ×3 IMPLANT
SYR CONTROL 10ML LL (SYRINGE) ×3 IMPLANT
TOWEL OR 17X26 10 PK STRL BLUE (TOWEL DISPOSABLE) ×3 IMPLANT
TRAY DSU PREP LF (CUSTOM PROCEDURE TRAY) ×3 IMPLANT
TUBE CONNECTING 12'X1/4 (SUCTIONS) ×1
TUBE CONNECTING 12X1/4 (SUCTIONS) ×2 IMPLANT
WATER STERILE IRR 500ML POUR (IV SOLUTION) ×3 IMPLANT
YANKAUER SUCT BULB TIP NO VENT (SUCTIONS) ×3 IMPLANT

## 2020-04-30 NOTE — Discharge Instructions (Signed)
Scrotal surgery postoperative instructions  Wound:  In most cases your incision will have absorbable sutures that will dissolve within the first 2-3 weeks. Some will fall out even earlier. Expect some redness as the sutures dissolve but this should occur only around the sutures. If there is generalized redness, especially with increasing pain or swelling, let us know. The scrotum will very likely get "black and blue" as the blood in the tissues spread. Sometimes the whole scrotum will turn colors. The black and blue is followed by a yellow and brown color. In time, all the discoloration will go away. In some cases some firm swelling in the area of the testicle may persist for up to 4-6 weeks after the surgery and is considered normal in most cases.  Drain:  If the surgeon placed a drain through the bottom part of your scrotum, it is held in with a small stitch. When instructed, cut the small stitch and slide to drain out. OK to remove it on Tues 10/5. Once the drain has been removed, a small hole made drain out for another day or 2. If so, keep a clean washcloth underneath your supportive undergarment, or sterile gauze. Until the hole seals up, all bathing should be in the shower, and not in the bathtub.  Diet:  You may return to your normal diet within 24 hours following your surgery. You may note some mild nausea and possibly vomiting the first 6-8 hours following surgery. This is usually due to the side effects of anesthesia, and will disappear quite soon. I would suggest clear liquids and a very light meal the first evening following your surgery.  Activity:  Your physical activity should be restricted the first 48 hours. During that time you should remain relatively inactive, moving about only when necessary. During the first 7-10 days following surgery you should avoid lifting any heavy objects (anything greater than 15 pounds), and avoid strenuous exercise. If you work, ask [place] specifically  about your restrictions, both for work and home. We will write a note to your employer if needed.  You should plan to wear a tight pair of jockey shorts or an athletic supporter for the first 4-5 days, even to sleep. This will keep the scrotum immobilized to some degree and keep the swelling down. You may find it more comfortable to wear a support longer.  Ice packs should be placed on and off over the scrotum for the first 48 hours. Frozen peas or corn in a ZipLock bag can be frozen, used and re-frozen. Fifteen minutes on and 15 minutes off is a reasonable schedule. The ice is a good pain reliever and keeps the swelling down.  Hygiene:  You may shower 48 hours after your surgery. Tub bathing should be restricted until the seventh day.  Medication:  You will be sent home with some type of pain medication. In many cases you will be sent home with a narcotic pain pill (Vicodin or Tylox). If the pain is not too bad, you may take either Tylenol (acetaminophen) or Advil (ibuprofen) which contain no narcotic agents, and might be tolerated a little better, with fewer side effects. If the pain medication you are sent home with does not control the pain, you will have to let us know. Some narcotic pain medications cannot be given or refilled by a phone call to a pharmacy.  Problems you should report to [place]:   Fever of 101.0 degrees Fahrenheit or greater.  Moderate or severe swelling under  the skin incision or involving the scrotum.  Drug reaction such as hives, a rash, nausea or vomiting.    Post Anesthesia Home Care Instructions  Activity: Get plenty of rest for the remainder of the day. A responsible individual must stay with you for 24 hours following the procedure.  For the next 24 hours, DO NOT: -Drive a car -Paediatric nurse -Drink alcoholic beverages -Take any medication unless instructed by your physician -Make any legal decisions or sign important papers.  Meals: Start with  liquid foods such as gelatin or soup. Progress to regular foods as tolerated. Avoid greasy, spicy, heavy foods. If nausea and/or vomiting occur, drink only clear liquids until the nausea and/or vomiting subsides. Call your physician if vomiting continues.  Special Instructions/Symptoms: Your throat may feel dry or sore from the anesthesia or the breathing tube placed in your throat during surgery. If this causes discomfort, gargle with warm salt water. The discomfort should disappear within 24 hours.  If you had a scopolamine patch placed behind your ear for the management of post- operative nausea and/or vomiting:  1. The medication in the patch is effective for 72 hours, after which it should be removed.  Wrap patch in a tissue and discard in the trash. Wash hands thoroughly with soap and water. 2. You may remove the patch earlier than 72 hours if you experience unpleasant side effects which may include dry mouth, dizziness or visual disturbances. 3. Avoid touching the patch. Wash your hands with soap and water after contact with the patch.

## 2020-04-30 NOTE — Anesthesia Preprocedure Evaluation (Addendum)
Anesthesia Evaluation  Patient identified by MRN, date of birth, ID band Patient awake    Reviewed: Allergy & Precautions, NPO status , Patient's Chart, lab work & pertinent test results  History of Anesthesia Complications Negative for: history of anesthetic complications  Airway Mallampati: II  TM Distance: >3 FB Neck ROM: Full    Dental  (+) Dental Advisory Given, Chipped   Pulmonary neg pulmonary ROS,    Pulmonary exam normal        Cardiovascular hypertension, Pt. on medications Normal cardiovascular exam     Neuro/Psych negative neurological ROS  negative psych ROS   GI/Hepatic negative GI ROS, Neg liver ROS,   Endo/Other   Hx pheochromocytoma s/p excision    Renal/GU negative Renal ROS     Musculoskeletal  Gout    Abdominal   Peds  Hematology negative hematology ROS (+)   Anesthesia Other Findings Covid test negative   Reproductive/Obstetrics                            Anesthesia Physical Anesthesia Plan  ASA: II  Anesthesia Plan: General   Post-op Pain Management:    Induction: Intravenous  PONV Risk Score and Plan: 2 and Treatment may vary due to age or medical condition, Ondansetron and Dexamethasone  Airway Management Planned: LMA  Additional Equipment: None  Intra-op Plan:   Post-operative Plan: Extubation in OR  Informed Consent: I have reviewed the patients History and Physical, chart, labs and discussed the procedure including the risks, benefits and alternatives for the proposed anesthesia with the patient or authorized representative who has indicated his/her understanding and acceptance.     Dental advisory given  Plan Discussed with: CRNA and Anesthesiologist  Anesthesia Plan Comments:        Anesthesia Quick Evaluation

## 2020-04-30 NOTE — Interval H&P Note (Signed)
History and Physical Interval Note:  04/30/2020 12:11 PM  [name]  has presented today for surgery, with the diagnosis of RIGHT HYDROCELE.  The various methods of treatment have been discussed with the patient and family. After consideration of risks, benefits and other options for treatment, the patient has consented to  Procedure(s) with comments: HYDROCELECTOMY ADULT (Right) - 1 HR as a surgical intervention.  The patient's history has been reviewed, patient examined, no change in status, stable for surgery.  I have reviewed the patient's chart and labs.  Questions were answered to the patient's satisfaction.     Lillette Boxer Morty Ortwein

## 2020-04-30 NOTE — Op Note (Signed)
Preoperative diagnosis: Right hydrocele   Postoperative diagnosis: Same   Procedure:Right hydrocele repair   Surgeon: Lillette Boxer. Ollis Daudelin, M.D.   Anesthesia: Gen.   Indications: Patient presented with scrotal swelling. A hydrocele was confirmed with scrotal ultrasonography. The patient was symptomatic from his hydrocele and requested surgical intervention. He appeared to understand the risks benefits potential complications of this procedure.   Procedure: The patient was properly identified and marked in the holding room. He received preoperative IV antibiotics. He was then taken to the operating room where general anesthetic was administered using the LMA. The scrotum was then prepped and draped in the usual manner. Appropriate surgical timeout was performed. An incision was made in the median raphae of the scrotum. The hydrocele was encountered which was freed from the scrotal wall and then the testis and hydrocele sac were delivered from the incision. The hydrocele sac was then incised anteriorly and a large amount of fluid was obtained. The testis was carefully inspected and no other pathology was appreciated. The hydrocele sac was moderate in thickness.  The sac was then plicated posteriorly with a running 3-0 chromic suture. The spermatic cord block was then performed with  10 ml of 0.25% plain Marcaine. The testis was returned to the hemiscrotum taking great care to make sure that there was no twisting of the spermatic cord. Inspection of the inside of the scrotum revealed no significant bleeding. A quarter-inch Penrose was brought through the lower aspect of the scrotum into the affected hemiscrotum and sutured to the skin. The scrotal incision was then closed anatomically, first with a running 3-0 chromic reapproximating the dartos fascia, and then a 4-0 Monocryl used to close the skin in a simple running fashion. Dermabond was placed.  A fluff dressing and jockstrap was then placed Sponge and  needle counts were correct. No obvious complications occurred and the patient was brought to recovery room in stable condition.

## 2020-04-30 NOTE — Anesthesia Procedure Notes (Signed)
Procedure Name: LMA Insertion Date/Time: 04/30/2020 12:36 PM Performed by: Bonney Aid, CRNA Pre-anesthesia Checklist: Patient identified, Emergency Drugs available, Suction available and Patient being monitored Patient Re-evaluated:Patient Re-evaluated prior to induction Oxygen Delivery Method: Circle system utilized Preoxygenation: Pre-oxygenation with 100% oxygen Induction Type: IV induction Ventilation: Mask ventilation without difficulty LMA: LMA inserted LMA Size: 5.0 Number of attempts: 1 Airway Equipment and Method: Bite block Placement Confirmation: positive ETCO2 Tube secured with: Tape Dental Injury: Teeth and Oropharynx as per pre-operative assessment

## 2020-04-30 NOTE — Anesthesia Postprocedure Evaluation (Signed)
Anesthesia Post Note  Patient: [name]  Procedure(s) Performed: HYDROCELECTOMY ADULT (Right Scrotum)     Patient location during evaluation: PACU Anesthesia Type: General Level of consciousness: awake and alert Pain management: pain level controlled Vital Signs Assessment: post-procedure vital signs reviewed and stable Respiratory status: spontaneous breathing, nonlabored ventilation and respiratory function stable Cardiovascular status: blood pressure returned to baseline and stable Postop Assessment: no apparent nausea or vomiting Anesthetic complications: no   No complications documented.  Last Vitals:  Vitals:   04/30/20 1316 04/30/20 1330  BP: 123/78 127/88  Pulse: 76 67  Resp: 16 10  Temp: (!) 36.4 C   SpO2: 98% 96%    Last Pain:  Vitals:   04/30/20 1316  TempSrc:   PainSc: 0-No pain                 Audry Pili

## 2020-04-30 NOTE — Transfer of Care (Signed)
Immediate Anesthesia Transfer of Care Note  Patient: [name]  Procedure(s) Performed: HYDROCELECTOMY ADULT (Right Scrotum)  Patient Location: PACU  Anesthesia Type:General  Level of Consciousness: awake, alert  and oriented  Airway & Oxygen Therapy: Patient Spontanous Breathing and Patient connected to nasal cannula oxygen  Post-op Assessment: Report given to RN  Post vital signs: Reviewed and stable  Last Vitals:  Vitals Value Taken Time  BP 123/78 04/30/20 1316  Temp    Pulse 73 04/30/20 1318  Resp 8 04/30/20 1318  SpO2 99 % 04/30/20 1318  Vitals shown include unvalidated device data.  Last Pain:  Vitals:   04/30/20 1108  TempSrc: Oral         Complications: No complications documented.

## 2020-05-01 ENCOUNTER — Encounter (HOSPITAL_BASED_OUTPATIENT_CLINIC_OR_DEPARTMENT_OTHER): Payer: Self-pay | Admitting: Urology

## 2020-05-26 ENCOUNTER — Other Ambulatory Visit: Payer: Self-pay | Admitting: Urology

## 2020-06-10 ENCOUNTER — Ambulatory Visit: Payer: PRIVATE HEALTH INSURANCE

## 2020-09-24 DIAGNOSIS — M109 Gout, unspecified: Secondary | ICD-10-CM | POA: Diagnosis not present

## 2020-09-24 DIAGNOSIS — R609 Edema, unspecified: Secondary | ICD-10-CM | POA: Diagnosis not present

## 2020-09-24 DIAGNOSIS — I10 Essential (primary) hypertension: Secondary | ICD-10-CM | POA: Diagnosis not present

## 2020-09-24 DIAGNOSIS — R109 Unspecified abdominal pain: Secondary | ICD-10-CM | POA: Diagnosis not present

## 2020-11-11 DIAGNOSIS — R609 Edema, unspecified: Secondary | ICD-10-CM | POA: Diagnosis not present

## 2020-11-11 DIAGNOSIS — E559 Vitamin D deficiency, unspecified: Secondary | ICD-10-CM | POA: Diagnosis not present

## 2020-11-11 DIAGNOSIS — R109 Unspecified abdominal pain: Secondary | ICD-10-CM | POA: Diagnosis not present

## 2020-11-11 DIAGNOSIS — H5461 Unqualified visual loss, right eye, normal vision left eye: Secondary | ICD-10-CM | POA: Diagnosis not present

## 2020-11-11 DIAGNOSIS — I499 Cardiac arrhythmia, unspecified: Secondary | ICD-10-CM | POA: Diagnosis not present

## 2020-11-11 DIAGNOSIS — M109 Gout, unspecified: Secondary | ICD-10-CM | POA: Diagnosis not present

## 2020-11-11 DIAGNOSIS — I10 Essential (primary) hypertension: Secondary | ICD-10-CM | POA: Diagnosis not present

## 2020-11-11 DIAGNOSIS — Z79899 Other long term (current) drug therapy: Secondary | ICD-10-CM | POA: Diagnosis not present

## 2020-11-11 DIAGNOSIS — Z0001 Encounter for general adult medical examination with abnormal findings: Secondary | ICD-10-CM | POA: Diagnosis not present

## 2020-11-11 DIAGNOSIS — J309 Allergic rhinitis, unspecified: Secondary | ICD-10-CM | POA: Diagnosis not present

## 2020-11-30 ENCOUNTER — Other Ambulatory Visit: Payer: Self-pay | Admitting: Internal Medicine

## 2020-11-30 DIAGNOSIS — E785 Hyperlipidemia, unspecified: Secondary | ICD-10-CM

## 2020-12-15 ENCOUNTER — Ambulatory Visit
Admission: RE | Admit: 2020-12-15 | Discharge: 2020-12-15 | Disposition: A | Discharge status: Home / Self Care | Payer: No Typology Code available for payment source | Source: Ambulatory Visit | Attending: Internal Medicine | Admitting: Internal Medicine

## 2020-12-15 DIAGNOSIS — E785 Hyperlipidemia, unspecified: Secondary | ICD-10-CM

## 2020-12-17 DIAGNOSIS — I712 Thoracic aortic aneurysm, without rupture: Secondary | ICD-10-CM | POA: Diagnosis not present

## 2020-12-17 DIAGNOSIS — I10 Essential (primary) hypertension: Secondary | ICD-10-CM | POA: Diagnosis not present

## 2021-01-21 ENCOUNTER — Institutional Professional Consult (permissible substitution): Payer: Medicare Other | Admitting: Cardiothoracic Surgery

## 2021-01-21 ENCOUNTER — Encounter: Payer: Self-pay | Admitting: Cardiothoracic Surgery

## 2021-01-21 ENCOUNTER — Other Ambulatory Visit: Payer: Self-pay

## 2021-01-21 DIAGNOSIS — I712 Thoracic aortic aneurysm, without rupture, unspecified: Secondary | ICD-10-CM | POA: Insufficient documentation

## 2021-01-21 NOTE — Progress Notes (Signed)
[address]       [place] [address]             [phone]     CARDIOTHORACIC SURGERY CONSULTATION REPORT  Referring Provider is Josetta Huddle, MD Primary Cardiologist is None PCP is Josetta Huddle, MD  Chief Complaint  Patient presents with   Thoracic Aortic Aneurysm    New patient consultation, Chest CT 12/15/20    HPI:  70 year old man without significant past medical history but with a family history of a brother who has a known abdominal aortic aneurysm requested cardiac calcium scoring scan at the advice of a friend.  When this was performed, he was found to have an ascending aortic aneurysm.  He had no known prior experience with aneurysm disease.  He is a non-smoker and he is treated for hypertension.  He recently started a statin for evidence of coronary calcification on the same CT scan.  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   Concussion    x 2 yrs ago no residual   Gout    Hypertension    Pneumonia yrs ago   Right hydrocele     Past Surgical History:  Procedure Laterality Date   COLONOSCOPY     Hx: of   ESOPHAGOGASTRODUODENOSCOPY N/A 10/24/2012   Procedure: ESOPHAGOGASTRODUODENOSCOPY (EGD);  Surgeon: Arta Silence, MD;  Location: [facility];  Service: Endoscopy;  Laterality: N/A;   EUS N/A 10/24/2012   Procedure: ESOPHAGEAL ENDOSCOPIC ULTRASOUND (EUS) RADIAL;  Surgeon: Arta Silence, MD;  [facility];  Service: Endoscopy;  Laterality: N/A;  + or - FNA   FINE NEEDLE ASPIRATION N/A 10/24/2012   Procedure: FINE NEEDLE ASPIRATION (FNA) LINEAR;  Surgeon: Arta Silence, MD;  Location[facility];  Service: Endoscopy;  Laterality: N/A;   HYDROCELE EXCISION Right 04/30/2020   Procedure: HYDROCELECTOMY ADULT;  Surgeon: Franchot Gallo, MD;  Location: [facility];  Service: Urology;   Laterality: Right;   [place]   right   LAPAROSCOPIC LIVER ULTRASOUND N/A 01/10/2013   Procedure: LAPAROSCOPIC LIVER ULTRASOUND;  Surgeon: Gayland Curry, MD;  Location: [facility];  Service: General;  Laterality: N/A;   LAPAROSCOPY N/A 01/10/2013   Procedure: LAPAROSCOPY DIAGNOSTIC;  Surgeon: Gayland Curry, MD;  Location: [facility];  Service: General;  Laterality: N/A;   RETROPERITONEAL MASS EXCISION  01/10/2013   TONSILLECTOMY  as child    Family History  Problem Relation Age of Onset   Heart disease Father     Social History   Socioeconomic History   Marital status: Married    Spouse name: Not on file   Number of children: Not on file   Years of education: Not on file   Highest education level: Not on file  Occupational History   Not on file  Tobacco Use   Smoking status: Never   Smokeless tobacco: Never  Vaping Use   Vaping Use: Never used  Substance and Sexual Activity   Alcohol use: Yes    Comment: 2- per day   Drug use: No    Types: Marijuana    Comment: Past Hx: of    Sexual activity: Yes  Other Topics Concern   Not on file  Social History Narrative   Not on file   Social Determinants of Health   Financial  Resource Strain: Not on file  Food Insecurity: Not on file  Transportation Needs: Not on file  Physical Activity: Not on file  Stress: Not on file  Social Connections: Not on file  Intimate Partner Violence: Not on file    Current Outpatient Medications  Medication Sig Dispense Refill   acetaminophen (TYLENOL) 500 MG tablet Take 1,000 mg by mouth every 6 (six) hours as needed.     allopurinol (ZYLOPRIM) 300 MG tablet Take 1 tablet by mouth daily.     amLODipine (NORVASC) 5 MG tablet 0.5 mg.     OVER THE COUNTER MEDICATION allerclear qhs allergy pill     valsartan-hydrochlorothiazide (DIOVAN-HCT) 320-12.5 MG tablet 1 tablet     No current facility-administered medications for this visit.    Allergies  Allergen Reactions   Sulfa  Antibiotics Itching   Cephalexin Rash      Review of Systems:   General:  Good energy level no weight change  Cardiac:  No history of dysrhythmias  Respiratory:  History of cough or shortness of breath  GI:   No abdominal pain or reflux or bleeding; history of abdominal neuroendocrine tumor resection 2014  GU:   No history of bladder or prostate disease  Vascular:  Claudication or venous disease  Neuro:   No strokes or TIAs  Musculoskeletal: Negative  Skin:   Negative  Psych:   No anxiety or depression  Eyes:   Wears glasses  ENT:   last saw dentist May 2022  Hematologic:  Negative  Endocrine:  Nondiabetic     Physical Exam:   BP (!) 153/85 (BP Location: Left Arm, Patient Position: Sitting, Cuff Size: Normal)   Pulse 78   Resp 20   Ht 5\' 10"  (1.778 m)   Wt 80.5 kg   SpO2 97%   BMI 25.45 kg/m   General:    well-appearing  HEENT:  Unremarkable   Neck:   no JVD, no bruits, no adenopathy   Chest:   clear to auscultation, symmetrical breath sounds, no wheezes, no rhonchi   CV:   RRR, no detectable murmur   Abdomen:  soft, non-tender, no masses   Extremities:  warm, well-perfused, pulses intact throughout, no LE edema  Rectal/GU  Deferred  Neuro:   Grossly non-focal and symmetrical throughout  Skin:   Clean and dry, no rashes, no breakdown   Diagnostic Tests:  I have personally reviewed his available imaging studies from 12/15/2020 which showed a 4.5 cm ascending aortic aneurysm   Impression:  70 year old man with incidentally discovered ascending arctic aneurysm which is sub-5 cm in maximal diameter   Plan:  Follow-up in 6 months with repeat noncontrast CT scan of the chest Report any severe chest or back pain to local emergency department Continue to monitor and control blood pressure as he is doing   I spent in excess of 20 minutes during the conduct of this office consultation and >50% of this time involved direct face-to-face encounter with the patient for  counseling and/or coordination of their care.          Level 3 Office Consult = 40 minutes         Level 4 Office Consult = 60 minutes         Level 5 Office Consult = 80 minutes  B.  Murvin Natal, MD 01/21/2021 5:22 PM

## 2021-06-08 ENCOUNTER — Encounter (HOSPITAL_COMMUNITY): Payer: Self-pay

## 2021-06-08 ENCOUNTER — Emergency Department (HOSPITAL_COMMUNITY)
Admission: EM | Admit: 2021-06-08 | Discharge: 2021-06-08 | Disposition: A | Discharge status: Home / Self Care | Payer: Medicare Other | Attending: Emergency Medicine | Admitting: Emergency Medicine

## 2021-06-08 ENCOUNTER — Emergency Department (HOSPITAL_COMMUNITY): Payer: Medicare Other

## 2021-06-08 DIAGNOSIS — M542 Cervicalgia: Secondary | ICD-10-CM | POA: Insufficient documentation

## 2021-06-08 DIAGNOSIS — I10 Essential (primary) hypertension: Secondary | ICD-10-CM | POA: Diagnosis not present

## 2021-06-08 DIAGNOSIS — M4312 Spondylolisthesis, cervical region: Secondary | ICD-10-CM | POA: Diagnosis not present

## 2021-06-08 DIAGNOSIS — Z79899 Other long term (current) drug therapy: Secondary | ICD-10-CM | POA: Insufficient documentation

## 2021-06-08 DIAGNOSIS — M5020 Other cervical disc displacement, unspecified cervical region: Secondary | ICD-10-CM | POA: Diagnosis not present

## 2021-06-08 DIAGNOSIS — S199XXA Unspecified injury of neck, initial encounter: Secondary | ICD-10-CM | POA: Diagnosis not present

## 2021-06-08 DIAGNOSIS — M47812 Spondylosis without myelopathy or radiculopathy, cervical region: Secondary | ICD-10-CM | POA: Diagnosis not present

## 2021-06-08 LAB — BASIC METABOLIC PANEL
Anion gap: 8 (ref 5–15)
BUN: 21 mg/dL (ref 8–23)
CO2: 26 mmol/L (ref 22–32)
Calcium: 9.4 mg/dL (ref 8.9–10.3)
Chloride: 99 mmol/L (ref 98–111)
Creatinine, Ser: 1.22 mg/dL (ref 0.61–1.24)
GFR, Estimated: >60 mL/min (ref >60)
Glucose, Bld: 95 mg/dL (ref 70–99)
Potassium: 3.6 mmol/L (ref 3.5–5.1)
Sodium: 133 mmol/L — ABNORMAL LOW (ref 135–145)

## 2021-06-08 MED ORDER — LIDOCAINE 5 % EX PTCH: 1.0000 | MEDICATED_PATCH | CUTANEOUS | 0 refills | Status: DC

## 2021-06-08 MED ORDER — KETOROLAC TROMETHAMINE 60 MG/2ML IM SOLN
30.0000 mg | Freq: Once | INTRAMUSCULAR | Status: AC
  Administered 2021-06-08: 30 mg via INTRAMUSCULAR
  Filled 2021-06-08: qty 2
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
## 2021-06-10 ENCOUNTER — Other Ambulatory Visit: Payer: Self-pay | Admitting: Thoracic Surgery (Cardiothoracic Vascular Surgery)

## 2021-06-10 DIAGNOSIS — I7121 Aneurysm of the ascending aorta, without rupture: Secondary | ICD-10-CM

## 2021-06-11 ENCOUNTER — Ambulatory Visit
Admission: RE | Admit: 2021-06-11 | Discharge: 2021-06-11 | Disposition: A | Discharge status: Home / Self Care | Payer: Medicare Other | Source: Ambulatory Visit | Attending: Thoracic Surgery (Cardiothoracic Vascular Surgery) | Admitting: Thoracic Surgery (Cardiothoracic Vascular Surgery)

## 2021-06-11 ENCOUNTER — Other Ambulatory Visit: Payer: Self-pay

## 2021-06-11 DIAGNOSIS — I712 Thoracic aortic aneurysm, without rupture, unspecified: Secondary | ICD-10-CM | POA: Diagnosis not present

## 2021-06-11 DIAGNOSIS — I700 Atherosclerosis of aorta: Secondary | ICD-10-CM | POA: Diagnosis not present

## 2021-06-11 DIAGNOSIS — I7121 Aneurysm of the ascending aorta, without rupture: Secondary | ICD-10-CM

## 2021-06-14 ENCOUNTER — Other Ambulatory Visit: Payer: Self-pay | Admitting: Thoracic Surgery (Cardiothoracic Vascular Surgery)

## 2021-06-14 DIAGNOSIS — I7121 Aneurysm of the ascending aorta, without rupture: Secondary | ICD-10-CM

## 2021-07-01 DIAGNOSIS — D1801 Hemangioma of skin and subcutaneous tissue: Secondary | ICD-10-CM | POA: Diagnosis not present

## 2021-07-01 DIAGNOSIS — L821 Other seborrheic keratosis: Secondary | ICD-10-CM | POA: Diagnosis not present

## 2021-07-01 DIAGNOSIS — D225 Melanocytic nevi of trunk: Secondary | ICD-10-CM | POA: Diagnosis not present

## 2021-07-01 DIAGNOSIS — L812 Freckles: Secondary | ICD-10-CM | POA: Diagnosis not present

## 2021-07-01 DIAGNOSIS — L308 Other specified dermatitis: Secondary | ICD-10-CM | POA: Diagnosis not present

## 2021-07-20 ENCOUNTER — Ambulatory Visit: Payer: Medicare Other | Admitting: Physician Assistant

## 2021-07-20 ENCOUNTER — Other Ambulatory Visit: Payer: Self-pay

## 2021-07-20 ENCOUNTER — Ambulatory Visit
Admission: RE | Admit: 2021-07-20 | Discharge: 2021-07-20 | Disposition: A | Discharge status: Home / Self Care | Payer: Medicare Other | Source: Ambulatory Visit | Attending: Thoracic Surgery (Cardiothoracic Vascular Surgery) | Admitting: Thoracic Surgery (Cardiothoracic Vascular Surgery)

## 2021-07-20 VITALS — BP 151/93 | HR 69 | Resp 20 | Ht 70.0 in | Wt 169.0 lb

## 2021-07-20 DIAGNOSIS — I7121 Aneurysm of the ascending aorta, without rupture: Secondary | ICD-10-CM | POA: Diagnosis not present

## 2021-07-20 NOTE — Progress Notes (Signed)
[address]       [place] [address]             [phone]       HPI: [name] is a 70 year old male who was referred to [place] after he was discovered to have an ascending aortic aneurysm on coronary calcium scoring CT done last year he is a non-smoker and has no prior personal or family history of aneurysmal disease.  He was asymptomatic at the time of this discovery. Changes last visit in June, he says he has had no symptoms of chest pain or shortness of breath.  He is experiencing some cold symptoms right now.   Current Outpatient Medications  Medication Sig Dispense Refill   acetaminophen (TYLENOL) 500 MG tablet Take 1,000 mg by mouth every 6 (six) hours as needed.     amLODipine (NORVASC) 5 MG tablet 0.5 mg.     valsartan-hydrochlorothiazide (DIOVAN-HCT) 320-12.5 MG tablet 1 tablet     No current facility-administered medications for this visit.    Physical Exam: Vital signs: BP 151/93 Pulse 69 Respirations 66  Pulm: 70 year old male accompanied by his wife today.  He does not know acute distress. Heart: Regular rate and rhythm, split S1, no murmur or rub Chest: Breath sounds are full, equal, and clear to auscultation Extremities: All well perfused, no peripheral edema   Diagnostic Tests: CLINICAL DATA:  Follow up thoracic aortic aneurysm. History of retroperitoneal mass excision.   EXAM: CT CHEST WITHOUT CONTRAST   TECHNIQUE: Multidetector CT imaging of the chest was performed following the standard protocol without IV contrast.   COMPARISON:  Cardiac CT 12/15/2020.  Abdominopelvic CT 01/18/2013.   FINDINGS: Cardiovascular: Again demonstrated is atherosclerosis of the aorta, great vessels and coronary arteries. There is mild fusiform dilatation of the ascending aorta which has a maximal transverse diameter of 4.6 cm, unchanged from previous study as measured in a similar fashion. No displaced intimal calcifications or  mediastinal hematoma. The heart size is normal. There is no pericardial effusion.   Mediastinum/Nodes: Low-density tubular structure along the right cardiophrenic angle is unchanged, measuring 3.0 x 0.9 cm and water density, likely a small pericardial cyst. There are no enlarged mediastinal, hilar or axillary lymph nodes. The thyroid gland, trachea and esophagus demonstrate no significant findings.   Lungs/Pleura: There is no pleural effusion. Small calcified left upper lobe granulomas. The lungs are otherwise clear, without suspicious pulmonary nodules.   Upper abdomen: Small gallstones and a right renal cyst are noted. No acute findings.   Musculoskeletal/Chest wall: There is no chest wall mass or suspicious osseous finding. There is an old right-sided rib fracture laterally.   IMPRESSION: 1. Stable dilatation of the ascending aorta, measuring up to 4.6 cm in diameter. Recommend semi-annual imaging followup by CTA or MRA. This recommendation follows 2010 ACCF/AHA/AATS/ACR/ASA/SCA/SCAI/SIR/STS/SVM Guidelines for the Diagnosis and Management of Patients With Thoracic Aortic Disease. Circulation. 2010; 121: N867-E720 2. No acute vascular findings. 3. 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 4. Coronary and Aortic Atherosclerosis (ICD10-I70.0). 5. Cholelithiasis.     Electronically Signed   By: [name] M.D.   On: 06/11/2021 11:26  Impression / Plan: 70 year old male incidentally discovered to have an ascending aortic aneurysm with a calcium scoring scan about 6 months ago.  Repeat CTA today shows no change in intervention of the ascending aorta.  Patient remains asymptomatic.  He has not had an echocardiogram.  Recommend follow-up in 6 months with  echo and CTA.  If he has no structural or functional valvular disease in no significant change in size of the aneurysm can likely be screened annually at that point.  Hypertension-BP 150/93 today.  [name] said his blood pressure is usually 429I systolic at home.  He has decreased his amlodipine to 2.5 mg p.o. daily on his own because he believes the prescribed dose makes him feel poorly.  I discussed the importance of strict blood pressure management in regards to the ascending aortic aneurysm.  He prefers to follow-up with his primary care physician, Dr. Josetta Wright,  in this regard.  Antony Odea, PA-C [facility] [phone]

## 2021-07-20 NOTE — Patient Instructions (Signed)
Continue to monitor your blood pressure at home.  Commend increasing the amlodipine back to 5 mg daily or ask your primary care physician to change this to another agent if amlodipine is not tolerated.  Avoid strenuous activity.  Follow-up in 6 months with an echocardiogram and CTA chest.

## 2021-10-26 DIAGNOSIS — H35373 Puckering of macula, bilateral: Secondary | ICD-10-CM | POA: Diagnosis not present

## 2021-10-26 DIAGNOSIS — H2513 Age-related nuclear cataract, bilateral: Secondary | ICD-10-CM | POA: Diagnosis not present

## 2021-10-26 DIAGNOSIS — H5213 Myopia, bilateral: Secondary | ICD-10-CM | POA: Diagnosis not present

## 2021-11-15 DIAGNOSIS — Z7182 Exercise counseling: Secondary | ICD-10-CM | POA: Diagnosis not present

## 2021-11-15 DIAGNOSIS — G629 Polyneuropathy, unspecified: Secondary | ICD-10-CM | POA: Diagnosis not present

## 2021-11-15 DIAGNOSIS — M79642 Pain in left hand: Secondary | ICD-10-CM | POA: Diagnosis not present

## 2021-11-15 DIAGNOSIS — Z0000 Encounter for general adult medical examination without abnormal findings: Secondary | ICD-10-CM | POA: Diagnosis not present

## 2021-11-15 DIAGNOSIS — E559 Vitamin D deficiency, unspecified: Secondary | ICD-10-CM | POA: Diagnosis not present

## 2021-11-15 DIAGNOSIS — I7121 Aneurysm of the ascending aorta, without rupture: Secondary | ICD-10-CM | POA: Diagnosis not present

## 2021-11-15 DIAGNOSIS — N1831 Chronic kidney disease, stage 3a: Secondary | ICD-10-CM | POA: Diagnosis not present

## 2021-11-15 DIAGNOSIS — I10 Essential (primary) hypertension: Secondary | ICD-10-CM | POA: Diagnosis not present

## 2021-11-15 DIAGNOSIS — E785 Hyperlipidemia, unspecified: Secondary | ICD-10-CM | POA: Diagnosis not present

## 2021-11-15 DIAGNOSIS — M545 Low back pain, unspecified: Secondary | ICD-10-CM | POA: Diagnosis not present

## 2021-11-15 DIAGNOSIS — R600 Localized edema: Secondary | ICD-10-CM | POA: Diagnosis not present

## 2021-12-14 ENCOUNTER — Other Ambulatory Visit: Payer: Self-pay | Admitting: Thoracic Surgery (Cardiothoracic Vascular Surgery)

## 2021-12-14 DIAGNOSIS — I7121 Aneurysm of the ascending aorta, without rupture: Secondary | ICD-10-CM

## 2022-01-06 ENCOUNTER — Ambulatory Visit (HOSPITAL_COMMUNITY): Payer: Medicare Other | Attending: Cardiology

## 2022-01-06 DIAGNOSIS — I7121 Aneurysm of the ascending aorta, without rupture: Secondary | ICD-10-CM | POA: Insufficient documentation

## 2022-01-06 LAB — ECHOCARDIOGRAM COMPLETE
Area-P 1/2: 3.74 cm2
S' Lateral: 2.8 cm

## 2022-01-06 MED ORDER — PERFLUTREN LIPID MICROSPHERE
1.0000 mL | INTRAVENOUS | Status: AC | PRN
  Administered 2022-01-06: 2 mL via INTRAVENOUS

## 2022-01-18 ENCOUNTER — Other Ambulatory Visit: Payer: Medicare Other

## 2022-01-21 ENCOUNTER — Other Ambulatory Visit: Payer: Medicare Other

## 2022-01-21 ENCOUNTER — Ambulatory Visit: Payer: Medicare Other | Admitting: Thoracic Surgery (Cardiothoracic Vascular Surgery)

## 2022-02-04 ENCOUNTER — Ambulatory Visit
Admission: RE | Admit: 2022-02-04 | Discharge: 2022-02-04 | Disposition: A | Discharge status: Home / Self Care | Payer: Medicare Other | Source: Ambulatory Visit | Attending: Thoracic Surgery (Cardiothoracic Vascular Surgery) | Admitting: Thoracic Surgery (Cardiothoracic Vascular Surgery)

## 2022-02-04 ENCOUNTER — Ambulatory Visit: Payer: Medicare Other | Admitting: Thoracic Surgery (Cardiothoracic Vascular Surgery)

## 2022-02-04 DIAGNOSIS — I712 Thoracic aortic aneurysm, without rupture, unspecified: Secondary | ICD-10-CM | POA: Diagnosis not present

## 2022-02-04 DIAGNOSIS — I7121 Aneurysm of the ascending aorta, without rupture: Secondary | ICD-10-CM

## 2022-02-04 MED ORDER — IOPAMIDOL (ISOVUE-370) INJECTION 76%
75.0000 mL | Freq: Once | INTRAVENOUS | Status: AC | PRN
  Administered 2022-02-04: 75 mL via INTRAVENOUS

## 2022-02-11 ENCOUNTER — Ambulatory Visit (INDEPENDENT_AMBULATORY_CARE_PROVIDER_SITE_OTHER): Payer: Medicare Other | Admitting: Thoracic Surgery (Cardiothoracic Vascular Surgery)

## 2022-02-11 DIAGNOSIS — I7121 Aneurysm of the ascending aorta, without rupture: Secondary | ICD-10-CM

## 2022-02-11 NOTE — Progress Notes (Deleted)
     [address]       [place] [address]             [phone]       Patient: Home Provider: Office Consent for Telemedicine visit obtained.  Today's visit was completed via a real-time telehealth (see specific modality noted below). The patient/authorized person provided oral consent at the time of the visit to engage in a telemedicine encounter with the present provider at [facility]. The patient/authorized person was informed of the potential benefits, limitations, and risks of telemedicine. The patient/authorized person expressed understanding that the laws that protect confidentiality also apply to telemedicine. The patient/authorized person acknowledged understanding that telemedicine does not provide emergency services and that he or she would need to call 911 or proceed to the nearest hospital for help if such a need arose.   Total time spent in the clinical discussion *** minutes.  Telehealth Modality: Phone visit (audio only)  I had a telephone visit with ***  IMPRESSION: 1. Slight interval increase in size of the fusiform ascending aortic aneurysm which now measures 4.9 cm previously 4.6 cm. Ascending thoracic aortic aneurysm. Recommend semi-annual imaging followup by CTA or MRA and referral to cardiothoracic surgery if not already obtained. This recommendation follows 2010 ACCF/AHA/AATS/ACR/ASA/SCA/SCAI/SIR/STS/SVM Guidelines for the Diagnosis and Management of Patients With Thoracic Aortic Disease. Circulation. 2010; 121: P112-T624. Aortic aneurysm NOS (ICD10-I71.9) 2. 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 3.  Aortic Atherosclerosis (ICD10-I70.0).

## 2022-02-11 NOTE — Progress Notes (Signed)
[address]       [place] [address]             [phone]       Patient: Home Provider: Office Consent for Telemedicine visit obtained.  Today's visit was completed via a real-time telehealth (see specific modality noted below). The patient/authorized person provided oral consent at the time of the visit to engage in a telemedicine encounter with the present provider at [facility]. The patient/authorized person was informed of the potential benefits, limitations, and risks of telemedicine. The patient/authorized person expressed understanding that the laws that protect confidentiality also apply to telemedicine. The patient/authorized person acknowledged understanding that telemedicine does not provide emergency services and that he or she would need to call 911 or proceed to the nearest hospital for help if such a need arose.   Total time spent in the clinical discussion 10 minutes.  Telehealth Modality: Phone visit (audio only)  Recent Radiology Findings:   CT ANGIO CHEST AORTA W/CM & OR WO/CM  Result Date: 02/04/2022 CLINICAL DATA:  Follow-up aortic aneurysm EXAM: CT ANGIOGRAPHY CHEST WITH CONTRAST TECHNIQUE: Multidetector CT imaging of the chest was performed using the standard protocol during bolus administration of intravenous contrast. Multiplanar CT image reconstructions and MIPs were obtained to evaluate the vascular anatomy. RADIATION DOSE REDUCTION: This exam was performed according to the departmental dose-optimization program which includes automated exposure control, adjustment of the mA and/or kV according to patient size and/or use of iterative reconstruction technique. Creatinine was obtained on site at [facility] at [address] Results: Creatinine 1.3 mg/dL. CONTRAST:  41m ISOVUE-370 IOPAMIDOL (ISOVUE-370) INJECTION 76% COMPARISON:  Chest CT June 11, 2021 FINDINGS: Cardiovascular: Fusiform ascending aortic aneurysm measures 4.9 cm on  image 76/5 previously 4.6 cm when measured in similar fashion. Normal caliber aortic arch and descending aorta. Aortic atherosclerosis. No central pulmonary embolus on this nondedicated study. Normal size heart. No significant pericardial effusion/thickening. Mediastinum/Nodes: No suspicious thyroid nodule. No pathologically enlarged mediastinal, hilar or axillary lymph nodes. The esophagus is grossly unremarkable. Similar small pericardial cyst along the right cardiophrenic angle measuring 2.5 x 0.8 cm on image 95/5 previously 3.0 x 0.9 cm. Lungs/Pleura: Small left upper lobe calcified granulomata. No suspicious pulmonary nodules or masses. No pleural effusion. No pneumothorax. Upper Abdomen: No acute abnormality. Musculoskeletal: Probable hemangioma in the T12 vertebral body is similar dating back to November 15, 2012. Remote right-sided rib fracture. Review of the MIP images confirms the above findings. IMPRESSION: 1. Slight interval increase in size of the fusiform ascending aortic aneurysm which now measures 4.9 cm previously 4.6 cm. Ascending thoracic aortic aneurysm. Recommend semi-annual imaging followup by CTA or MRA and referral to cardiothoracic surgery if not already obtained. This recommendation follows 2010 ACCF/AHA/AATS/ACR/ASA/SCA/SCAI/SIR/STS/SVM Guidelines for the Diagnosis and Management of Patients With Thoracic Aortic Disease. Circulation. 2010; 121:: N397-Q734 Aortic aneurysm NOS (ICD10-I71.9) 2. 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 3.  Aortic Atherosclerosis (ICD10-I70.0). Electronically Signed   By: JDahlia BailiffM.D.   On: 02/04/2022 12:48    I had a telephone visit with [name]  Overall doing well.  Assessment:  89yomale with a 4.9 cm ascending aortic aneurysm.  Echocardiogram shows a tricuspid valve without evidence of regurgitation.  We discussed the natural history and and risk factors for growth of ascending aortic aneurysms.  We covered the  importance of smoking cessation, tight blood pressure control, refraining from lifting heavy objects, and avoiding  fluoroquinolones.  The patient is aware of signs and symptoms of aortic dissection and when to present to the emergency department.  We will continue surveillance and a repeat CT was ordered for 6 months.  [name]

## 2022-03-11 DIAGNOSIS — I10 Essential (primary) hypertension: Secondary | ICD-10-CM | POA: Diagnosis not present

## 2022-03-24 DIAGNOSIS — N1831 Chronic kidney disease, stage 3a: Secondary | ICD-10-CM | POA: Diagnosis not present

## 2022-03-24 DIAGNOSIS — I7121 Aneurysm of the ascending aorta, without rupture: Secondary | ICD-10-CM | POA: Diagnosis not present

## 2022-03-24 DIAGNOSIS — I10 Essential (primary) hypertension: Secondary | ICD-10-CM | POA: Diagnosis not present

## 2022-03-24 DIAGNOSIS — M109 Gout, unspecified: Secondary | ICD-10-CM | POA: Diagnosis not present

## 2022-04-07 ENCOUNTER — Telehealth: Payer: Self-pay

## 2022-04-07 NOTE — Patient Outreach (Signed)
  Care Coordination   04/07/2022 Name: [name] MRN: [id number] DOB: [birth year]   Care Coordination Outreach Attempts:  An unsuccessful telephone outreach was attempted today to offer the patient information about available care coordination services as a benefit of their health plan.   Follow Up Plan:  Additional outreach attempts will be made to offer the patient care coordination information and services.   Encounter Outcome:  No Answer  Care Coordination Interventions Activated:  No   Care Coordination Interventions:  No, not indicated    [facility] [phone]

## 2022-04-08 DIAGNOSIS — E871 Hypo-osmolality and hyponatremia: Secondary | ICD-10-CM | POA: Diagnosis not present

## 2022-04-08 DIAGNOSIS — I7121 Aneurysm of the ascending aorta, without rupture: Secondary | ICD-10-CM | POA: Diagnosis not present

## 2022-04-08 DIAGNOSIS — I10 Essential (primary) hypertension: Secondary | ICD-10-CM | POA: Diagnosis not present

## 2022-05-19 DIAGNOSIS — R002 Palpitations: Secondary | ICD-10-CM | POA: Diagnosis not present

## 2022-05-19 DIAGNOSIS — Z23 Encounter for immunization: Secondary | ICD-10-CM | POA: Diagnosis not present

## 2022-05-19 DIAGNOSIS — I7121 Aneurysm of the ascending aorta, without rupture: Secondary | ICD-10-CM | POA: Diagnosis not present

## 2022-05-19 DIAGNOSIS — I10 Essential (primary) hypertension: Secondary | ICD-10-CM | POA: Diagnosis not present

## 2022-05-19 DIAGNOSIS — I4891 Unspecified atrial fibrillation: Secondary | ICD-10-CM | POA: Diagnosis not present

## 2022-05-25 ENCOUNTER — Encounter: Payer: Self-pay | Admitting: Internal Medicine

## 2022-05-25 ENCOUNTER — Ambulatory Visit: Payer: Medicare Other | Admitting: Internal Medicine

## 2022-05-25 VITALS — BP 148/105 | HR 71 | Temp 97.4°F | Resp 16 | Ht 70.0 in | Wt 173.0 lb

## 2022-05-25 DIAGNOSIS — I4891 Unspecified atrial fibrillation: Secondary | ICD-10-CM

## 2022-05-25 DIAGNOSIS — I10 Essential (primary) hypertension: Secondary | ICD-10-CM | POA: Diagnosis not present

## 2022-05-25 DIAGNOSIS — Z86018 Personal history of other benign neoplasm: Secondary | ICD-10-CM

## 2022-05-25 MED ORDER — ROSUVASTATIN CALCIUM 5 MG PO TABS: 5.0000 mg | ORAL_TABLET | Freq: Every day | ORAL | 3 refills | Status: DC

## 2022-05-25 MED ORDER — APIXABAN 5 MG PO TABS: 5.0000 mg | ORAL_TABLET | Freq: Two times a day (BID) | ORAL | 6 refills | Status: DC

## 2022-05-25 MED ORDER — METOPROLOL SUCCINATE ER 100 MG PO TB24: 100.0000 mg | ORAL_TABLET | Freq: Every day | ORAL | 3 refills | Status: DC

## 2022-05-25 MED ORDER — AMLODIPINE BESYLATE 5 MG PO TABS: 5.0000 mg | ORAL_TABLET | Freq: Every day | ORAL | 3 refills | Status: DC

## 2022-05-25 NOTE — Progress Notes (Signed)
Primary Physician/Referring:  Kathalene Frames, MD  Patient ID: [name], male    DOB: Feb 02, 1951, 71 y.o.   MRN: [id number]  Chief Complaint  Patient presents with   Atrial Fibrillation   New Patient (Initial Visit)   HPI:    [name]  is a 71 y.o. 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  Patient states he has just been feeling off for the past couple of weeks.  He states he can tell when his blood pressure is up and he feels it has been like that for a couple of weeks now.  He also feel like he cannot do the same activities he used to do anymore.  Patient has never had a stress test in the past.  He does not feel it when he is in atrial fibrillation and his rate is well controlled.  Patient was not initiated on anticoagulation, however he is agreeable to starting Eliquis for stroke prevention given his A-fib.  He has not tolerated statin in the past but he is willing to try 5 mg Crestor.  Patient denies shortness of breath, diaphoresis, syncope, edema, orthopnea, PND.  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   Concussion    x 2 yrs ago no residual   Gout    Hypertension    Pneumonia yrs ago   Right hydrocele    Past Surgical History:  Procedure Laterality Date   COLONOSCOPY     Hx: of   ESOPHAGOGASTRODUODENOSCOPY N/A 10/24/2012   Procedure: ESOPHAGOGASTRODUODENOSCOPY (EGD);  Surgeon: Arta Silence, MD;  Location: [facility];  Service: Endoscopy;  Laterality: N/A;   EUS N/A 10/24/2012   Procedure: ESOPHAGEAL ENDOSCOPIC ULTRASOUND (EUS) RADIAL;  Surgeon: Arta Silence, MD;  Location: [facility];  Service: Endoscopy;  Laterality: N/A;  + or - FNA   FINE NEEDLE ASPIRATION N/A 10/24/2012   Procedure: FINE NEEDLE ASPIRATION (FNA) LINEAR;   Surgeon: Arta Silence, MD;  Location: [facility];  Service: Endoscopy;  Laterality: N/A;   HYDROCELE EXCISION Right 04/30/2020   Procedure: HYDROCELECTOMY ADULT;  Surgeon: Franchot Gallo, MD;  Location: [facility];  Service: Urology;  Laterality: Right;   INGUINAL HERNIA REPAIR  1985   right   LAPAROSCOPIC LIVER ULTRASOUND N/A 01/10/2013   Procedure: LAPAROSCOPIC LIVER ULTRASOUND;  Surgeon: Gayland Curry, MD;  Location: [place];  Service: General;  Laterality: N/A;   LAPAROSCOPY N/A 01/10/2013   Procedure: LAPAROSCOPY DIAGNOSTIC;  Surgeon: Gayland Curry, MD;  Location: [place];  Service: General;  Laterality: N/A;   RETROPERITONEAL MASS EXCISION  01/10/2013   TONSILLECTOMY  as child   Family History  Problem Relation Age of Onset   Heart disease Father     Social History   Tobacco Use   Smoking status: Never   Smokeless tobacco: Never  Substance Use Topics   Alcohol use: Yes    Comment: 2- per day   Marital Status: Married  ROS  Review of Systems  Cardiovascular:  Positive for chest pain, dyspnea on exertion and palpitations.   Objective  Blood pressure (!) 148/105, pulse 71, temperature (!) 97.4 F (36.3 C), temperature source Temporal, resp. rate 16, height '5\' 10"'$  (1.778 m), weight 173 lb (78.5 kg), SpO2 99 %. Body mass index is 24.82 kg/m.  05/25/2022    9:07 AM 05/25/2022    8:59 AM 07/20/2021   11:12 AM  Vitals with BMI  Height  '5\' 10"'$  '5\' 10"'$   Weight  173 lbs 169 lbs  BMI  21.30 86.57  Systolic 846 962 952  Diastolic 841 324 93  Pulse 71 78 69     Physical Exam Vitals reviewed.  HENT:     Head: Normocephalic and atraumatic.  Cardiovascular:     Rate and Rhythm: Normal rate. Rhythm irregular.     Pulses: Normal pulses.     Heart sounds: Normal heart sounds. No murmur heard. Pulmonary:     Effort: Pulmonary effort is normal.     Breath sounds: Normal breath sounds.  Abdominal:     General: Bowel sounds are normal.  Musculoskeletal:      Right lower leg: No edema.     Left lower leg: No edema.  Skin:    General: Skin is warm and dry.  Neurological:     Mental Status: He is alert.     Medications and allergies   Allergies  Allergen Reactions   Sulfa Antibiotics Itching   Cephalexin Rash     Medication list after today's encounter   Current Outpatient Medications:    acetaminophen (TYLENOL) 500 MG tablet, Take 1,000 mg by mouth every 6 (six) hours as needed., Disp: , Rfl:    apixaban (ELIQUIS) 5 MG TABS tablet, Take 1 tablet (5 mg total) by mouth 2 (two) times daily., Disp: 60 tablet, Rfl: 6   metoprolol succinate (TOPROL-XL) 100 MG 24 hr tablet, Take 1 tablet (100 mg total) by mouth daily. Take with or immediately following a meal., Disp: 90 tablet, Rfl: 3   rosuvastatin (CRESTOR) 5 MG tablet, Take 1 tablet (5 mg total) by mouth at bedtime., Disp: 90 tablet, Rfl: 3   valsartan-hydrochlorothiazide (DIOVAN-HCT) 320-12.5 MG tablet, 1 tablet, Disp: , Rfl:   Laboratory examination:   Lab Results  Component Value Date   NA 133 (L) 06/08/2021   K 3.6 06/08/2021   CO2 26 06/08/2021   GLUCOSE 95 06/08/2021   BUN 21 06/08/2021   CREATININE 1.22 06/08/2021   CALCIUM 9.4 06/08/2021   GFRNONAA >60 06/08/2021       Latest Ref Rng & Units 06/08/2021    4:46 PM 04/30/2020   11:30 AM 01/20/2013    5:25 AM  CMP  Glucose 70 - 99 mg/dL 95  101  85   BUN 8 - 23 mg/dL '21  31  13   '$ Creatinine 0.61 - 1.24 mg/dL 1.22  1.50  1.31   Sodium 135 - 145 mmol/L 133  138  127   Potassium 3.5 - 5.1 mmol/L 3.6  3.8  3.6   Chloride 98 - 111 mmol/L 99  99  94   CO2 22 - 32 mmol/L 26   24   Calcium 8.9 - 10.3 mg/dL 9.4   8.6       Latest Ref Rng & Units 04/30/2020   11:30 AM 01/19/2013    4:45 AM 01/17/2013    6:20 AM  CBC  WBC 4.0 - 10.5 K/uL  9.9  8.2   Hemoglobin 13.0 - 17.0 g/dL 15.3  12.2  11.1   Hematocrit 39.0 - 52.0 % 45.0  35.1  31.9   Platelets 150 - 400 K/uL  288  225     Lipid Panel No results for input(s):  "CHOL", "TRIG", "[place]", "VLDL", "HDL", "CHOLHDL", "LDLDIRECT" in the last 8760  hours.  HEMOGLOBIN A1C No results found for: "HGBA1C", "MPG" TSH No results for input(s): "TSH" in the last 8760 hours.  External labs:     Radiology:    Cardiac Studies:   ECHO COMPLETE WITH IMAGING ENHANCING AGENT 01/06/2022  Narrative ECHOCARDIOGRAM REPORT    Patient Name:   KIRTIS CHALLIS Date of Exam: 01/06/2022 Medical Rec #:  [id number]       Height:       70.0 in Accession #:    [id number]      Weight:       169.0 lb Date of Birth:  1951-05-21        BSA:          1.943 m Patient Age:    40 years        BP:           151/93 mmHg Patient Gender: M               HR:           82 bpm. Exam Location:  [place]  Procedure: 2D Echo, Cardiac Doppler, Color Doppler and Intracardiac Opacification Agent  Indications:    I71.2 Ascending aortic aneurysm  History:        Patient has no prior history of Echocardiogram examinations. Risk Factors:Hypertension. Pneumonia.  Sonographer:    Diamond Nickel RCS Referring Phys: [id number] [place]   1. Dilated ascending aorta - 45 mm. 2. Left ventricular ejection fraction, by estimation, is 60 to 65%. The left ventricle has normal function. The left ventricle has no regional wall motion abnormalities. Left ventricular diastolic parameters are indeterminate. 3. Right ventricular systolic function is normal. The right ventricular size is normal. 4. The mitral valve is normal in structure. Trivial mitral valve regurgitation. No evidence of mitral stenosis. 5. Restricted right coronary cusp. The aortic valve is calcified. There is moderate calcification of the aortic valve. There is moderate thickening of the aortic valve. Aortic valve regurgitation is not visualized. Aortic valve sclerosis/calcification is present, without any evidence of aortic stenosis. 6. Aortic dilatation noted. There is moderate dilatation of the aortic  root, measuring 45 mm. There is moderate dilatation of the ascending aorta, measuring 45 mm. 7. The inferior vena cava is normal in size with greater than 50% respiratory variability, suggesting right atrial pressure of 3 mmHg.  FINDINGS Left Ventricle: Left ventricular ejection fraction, by estimation, is 60 to 65%. The left ventricle has normal function. The left ventricle has no regional wall motion abnormalities. The left ventricular internal cavity size was normal in size. There is no left ventricular hypertrophy. Left ventricular diastolic parameters are indeterminate.  Right Ventricle: The right ventricular size is normal. No increase in right ventricular wall thickness. Right ventricular systolic function is normal.  Left Atrium: Left atrial size was normal in size.  Right Atrium: Right atrial size was normal in size.  Pericardium: There is no evidence of pericardial effusion.  Mitral Valve: The mitral valve is normal in structure. Trivial mitral valve regurgitation. No evidence of mitral valve stenosis.  Tricuspid Valve: The tricuspid valve is normal in structure. Tricuspid valve regurgitation is trivial. No evidence of tricuspid stenosis.  Aortic Valve: Restricted right coronary cusp. The aortic valve is calcified. There is moderate calcification of the aortic valve. There is moderate thickening of the aortic valve. Aortic valve regurgitation is not visualized. Aortic valve sclerosis/calcification is present, without any evidence of aortic stenosis.  Pulmonic Valve: The pulmonic valve  was normal in structure. Pulmonic valve regurgitation is trivial. No evidence of pulmonic stenosis.  Aorta: Aortic dilatation noted. There is moderate dilatation of the aortic root, measuring 45 mm. There is moderate dilatation of the ascending aorta, measuring 45 mm.  Venous: The inferior vena cava is normal in size with greater than 50% respiratory variability, suggesting right atrial pressure of 3  mmHg.  IAS/Shunts: No atrial level shunt detected by color flow Doppler.  Additional Comments: Dilated ascending aorta - 45 mm.   LEFT VENTRICLE PLAX 2D LVIDd:         4.00 cm   Diastology LVIDs:         2.80 cm   LV e' medial:    8.27 cm/s LV PW:         1.10 cm   LV E/e' medial:  12.7 LV IVS:        1.20 cm   LV e' lateral:   14.70 cm/s LVOT diam:     1.90 cm   LV E/e' lateral: 7.1 LV SV:         90 LV SV Index:   47 LVOT Area:     2.84 cm   RIGHT VENTRICLE RV Basal diam:  2.40 cm RV S prime:     11.20 cm/s TAPSE (M-mode): 1.7 cm  LEFT ATRIUM             Index        RIGHT ATRIUM           Index LA diam:        3.20 cm 1.65 cm/m   RA Area:     11.60 cm LA Vol (A2C):   42.5 ml 21.87 ml/m  RA Volume:   19.40 ml  9.98 ml/m LA Vol (A4C):   39.8 ml 20.48 ml/m LA Biplane Vol: 44.4 ml 22.85 ml/m AORTIC VALVE LVOT Vmax:   146.00 cm/s LVOT Vmean:  103.000 cm/s LVOT VTI:    0.319 m  AORTA Ao Root diam: 4.40 cm Ao Asc diam:  4.50 cm  MITRAL VALVE MV Area (PHT): 3.74 cm     SHUNTS MV Decel Time: 203 msec     Systemic VTI:  0.32 m MV E velocity: 105.00 cm/s  Systemic Diam: 1.90 cm MV A velocity: 96.40 cm/s MV E/A ratio:  1.09  Candee Furbish MD Electronically signed by Candee Furbish MD Signature Date/Time: 01/06/2022/4:57:37 PM    Final     No results found for this or any previous visit from the past 1095 days.     EKG:   05/25/2022: atrial fibrillation with controlled ventricular response  Assessment     ICD-10-CM   1. Atrial fibrillation, unspecified type (HCC)  I48.91 EKG 12-Lead    PCV MYOCARDIAL PERFUSION WO LEXISCAN    Metanephrines, plasma    Metanephrines, Urine, 24 hour    2. Essential hypertension  I10 PCV MYOCARDIAL PERFUSION WO LEXISCAN    Metanephrines, plasma    Metanephrines, Urine, 24 hour    3. Personal history of pheochromocytoma  Z86.018 PCV MYOCARDIAL PERFUSION WO LEXISCAN    Metanephrines, plasma    Metanephrines, Urine, 24 hour        Orders Placed This Encounter  Procedures   Metanephrines, plasma   Metanephrines, Urine, 24 hour   PCV MYOCARDIAL PERFUSION WO LEXISCAN    Standing Status:   Future    Standing Expiration Date:   07/25/2022   EKG 12-Lead    Meds ordered this encounter  Medications   apixaban (ELIQUIS) 5 MG TABS tablet    Sig: Take 1 tablet (5 mg total) by mouth 2 (two) times daily.    Dispense:  60 tablet    Refill:  6   DISCONTD: amLODipine (NORVASC) 5 MG tablet    Sig: Take 1 tablet (5 mg total) by mouth daily.    Dispense:  180 tablet    Refill:  3   metoprolol succinate (TOPROL-XL) 100 MG 24 hr tablet    Sig: Take 1 tablet (100 mg total) by mouth daily. Take with or immediately following a meal.    Dispense:  90 tablet    Refill:  3   rosuvastatin (CRESTOR) 5 MG tablet    Sig: Take 1 tablet (5 mg total) by mouth at bedtime.    Dispense:  90 tablet    Refill:  3    Medications Discontinued During This Encounter  Medication Reason   amLODipine (NORVASC) 5 MG tablet    amLODipine (NORVASC) 5 MG tablet    carvedilol (COREG) 6.25 MG tablet      Recommendations:   ARTUR WINNINGHAM is a 71 y.o.  male with Paroxysmal Afib   Atrial fibrillation, unspecified type ([place]) CHA2DS2VASc = 2 Will initiate Eliquis Echocardiogram from 12/2021 reviewed Will obtain stress test Switch coreg to Toprol XL   Essential hypertension Continue diovan-hctz combo Patient did not tolerate amlodipine in the past Encourage low-sodium diet, less than 2000 mg daily.   Personal history of pheochromocytoma Urine and plasma metanephrines ordered Patient had pheo removed ~ 10 years ago   Follow-up in 6 weeks or sooner if needed    Floydene Flock, DO, [facility]  05/25/2022, 12:15 PM Office: [phone] Pager: [phone]

## 2022-06-14 DIAGNOSIS — I10 Essential (primary) hypertension: Secondary | ICD-10-CM | POA: Diagnosis not present

## 2022-06-14 DIAGNOSIS — I4891 Unspecified atrial fibrillation: Secondary | ICD-10-CM | POA: Diagnosis not present

## 2022-06-14 DIAGNOSIS — Z86018 Personal history of other benign neoplasm: Secondary | ICD-10-CM | POA: Diagnosis not present

## 2022-06-17 DIAGNOSIS — I10 Essential (primary) hypertension: Secondary | ICD-10-CM | POA: Diagnosis not present

## 2022-06-17 DIAGNOSIS — I4891 Unspecified atrial fibrillation: Secondary | ICD-10-CM | POA: Diagnosis not present

## 2022-06-17 DIAGNOSIS — Z86018 Personal history of other benign neoplasm: Secondary | ICD-10-CM | POA: Diagnosis not present

## 2022-06-20 ENCOUNTER — Ambulatory Visit: Payer: Medicare Other

## 2022-06-20 DIAGNOSIS — I4891 Unspecified atrial fibrillation: Secondary | ICD-10-CM | POA: Diagnosis not present

## 2022-06-20 DIAGNOSIS — Z86018 Personal history of other benign neoplasm: Secondary | ICD-10-CM | POA: Diagnosis not present

## 2022-06-20 DIAGNOSIS — I10 Essential (primary) hypertension: Secondary | ICD-10-CM | POA: Diagnosis not present

## 2022-06-25 LAB — METANEPHRINES, URINE, 24 HOUR
Metaneph Total, Ur: 82 ug/L
Metanephrines, 24H Ur: 164 ug/24 hr (ref 58–276)
Normetanephrine, 24H Ur: 480 ug/24 hr (ref 156–729)
Normetanephrine, Ur: 240 ug/L

## 2022-06-27 LAB — METANEPHRINES, PLASMA
Metanephrine, Free: <25 pg/mL (ref 0.0–88.0)
Normetanephrine, Free: 66.6 pg/mL (ref 0.0–285.2)

## 2022-07-05 DIAGNOSIS — L308 Other specified dermatitis: Secondary | ICD-10-CM | POA: Diagnosis not present

## 2022-07-05 DIAGNOSIS — C44519 Basal cell carcinoma of skin of other part of trunk: Secondary | ICD-10-CM | POA: Diagnosis not present

## 2022-07-05 DIAGNOSIS — L812 Freckles: Secondary | ICD-10-CM | POA: Diagnosis not present

## 2022-07-05 DIAGNOSIS — D2262 Melanocytic nevi of left upper limb, including shoulder: Secondary | ICD-10-CM | POA: Diagnosis not present

## 2022-07-05 DIAGNOSIS — D1801 Hemangioma of skin and subcutaneous tissue: Secondary | ICD-10-CM | POA: Diagnosis not present

## 2022-07-05 DIAGNOSIS — D225 Melanocytic nevi of trunk: Secondary | ICD-10-CM | POA: Diagnosis not present

## 2022-07-05 DIAGNOSIS — D485 Neoplasm of uncertain behavior of skin: Secondary | ICD-10-CM | POA: Diagnosis not present

## 2022-07-05 DIAGNOSIS — D2261 Melanocytic nevi of right upper limb, including shoulder: Secondary | ICD-10-CM | POA: Diagnosis not present

## 2022-07-05 DIAGNOSIS — L821 Other seborrheic keratosis: Secondary | ICD-10-CM | POA: Diagnosis not present

## 2022-07-05 DIAGNOSIS — L570 Actinic keratosis: Secondary | ICD-10-CM | POA: Diagnosis not present

## 2022-07-08 ENCOUNTER — Ambulatory Visit: Payer: Medicare Other | Admitting: Internal Medicine

## 2022-07-08 VITALS — BP 143/93 | HR 63 | Ht 70.0 in | Wt 174.8 lb

## 2022-07-08 DIAGNOSIS — I10 Essential (primary) hypertension: Secondary | ICD-10-CM | POA: Diagnosis not present

## 2022-07-08 DIAGNOSIS — Z86018 Personal history of other benign neoplasm: Secondary | ICD-10-CM | POA: Diagnosis not present

## 2022-07-08 DIAGNOSIS — I4891 Unspecified atrial fibrillation: Secondary | ICD-10-CM | POA: Diagnosis not present

## 2022-07-08 MED ORDER — SPIRONOLACTONE 25 MG PO TABS: 12.5000 mg | ORAL_TABLET | Freq: Every day | ORAL | 3 refills | Status: DC

## 2022-07-08 NOTE — Progress Notes (Signed)
Primary Physician/Referring:  Kathalene Frames, MD  Patient ID: [name], male    DOB: 1951/03/15, 71 y.o.   MRN: [id number]  Chief Complaint  Patient presents with  . Atrial Fibrillation  . Follow-up   HPI:    [name]  is a 71 y.o. 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  Patient states he has just been feeling off for the past couple of weeks.  He states he can tell when his blood pressure is up and he feels it has been like that for a couple of weeks now.  He also feel like he cannot do the same activities he used to do anymore.  Patient has never had a stress test in the past.  He does not feel it when he is in atrial fibrillation and his rate is well controlled.  Patient was not initiated on anticoagulation, however he is agreeable to starting Eliquis for stroke prevention given his A-fib.  He has not tolerated statin in the past but he is willing to try 5 mg Crestor.  Patient denies shortness of breath, diaphoresis, syncope, edema, orthopnea, PND.  Past Medical History:  Diagnosis Date  . 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 Concussion    x 2 yrs ago no residual  . Gout   . Hypertension   . Pneumonia yrs ago  . Right hydrocele    Past Surgical History:  Procedure Laterality Date  . COLONOSCOPY     Hx: of  . ESOPHAGOGASTRODUODENOSCOPY N/A 10/24/2012   Procedure: ESOPHAGOGASTRODUODENOSCOPY (EGD);  Surgeon: Arta Silence, MD;  Location: [facility];  Service: Endoscopy;  Laterality: N/A;  . EUS N/A 10/24/2012   Procedure: ESOPHAGEAL ENDOSCOPIC ULTRASOUND (EUS) RADIAL;  Surgeon: Arta Silence, MD;  Location: [facility];  Service: Endoscopy;  Laterality: N/A;  + or - FNA  . FINE NEEDLE ASPIRATION N/A 10/24/2012   Procedure: FINE NEEDLE ASPIRATION (FNA) LINEAR;   Surgeon: Arta Silence, MD;  Location: [facility];  Service: Endoscopy;  Laterality: N/A;  . HYDROCELE EXCISION Right 04/30/2020   Procedure: HYDROCELECTOMY ADULT;  Surgeon: Franchot Gallo, MD;  Location: [facility];  Service: Urology;  Laterality: Right;  . [place]   right  . LAPAROSCOPIC LIVER ULTRASOUND N/A 01/10/2013   Procedure: LAPAROSCOPIC LIVER ULTRASOUND;  Surgeon: Gayland Curry, MD;  Location: [facility];  Service: General;  Laterality: N/A;  . LAPAROSCOPY N/A 01/10/2013   Procedure: LAPAROSCOPY DIAGNOSTIC;  Surgeon: Gayland Curry, MD;  Location: [facility];  Service: General;  Laterality: N/A;  . RETROPERITONEAL MASS EXCISION  01/10/2013  . TONSILLECTOMY  as child   Family History  Problem Relation Age of Onset  . Heart disease Father     Social History   Tobacco Use  . Smoking status: Never  . Smokeless tobacco: Never  Substance Use Topics  . Alcohol use: Yes    Comment: 2- per day   Marital Status: Married  ROS  Review of Systems  Cardiovascular:  Positive for chest pain, dyspnea on exertion and palpitations.   Objective  Blood pressure (!) 143/93, pulse 63, height '5\' 10"'$  (1.778 m), weight 174 lb 12.8 oz (79.3 kg), SpO2 98 %. Body mass index is 25.08 kg/m.     07/08/2022    9:33 AM 05/25/2022  9:07 AM 05/25/2022    8:59 AM  Vitals with BMI  Height '5\' 10"'$   '5\' 10"'$   Weight 174 lbs 13 oz  173 lbs  BMI 68.34  19.62  Systolic 229 798 921  Diastolic 93 194 174  Pulse 63 71 78     Physical Exam Vitals reviewed.  HENT:     Head: Normocephalic and atraumatic.  Cardiovascular:     Rate and Rhythm: Normal rate. Rhythm irregular.     Pulses: Normal pulses.     Heart sounds: Normal heart sounds. No murmur heard. Pulmonary:     Effort: Pulmonary effort is normal.     Breath sounds: Normal breath sounds.  Abdominal:     General: Bowel sounds are normal.  Musculoskeletal:     Right lower leg: No edema.     Left lower leg: No  edema.  Skin:    General: Skin is warm and dry.  Neurological:     Mental Status: He is alert.    Medications and allergies   Allergies  Allergen Reactions  . Sulfa Antibiotics Itching  . Cephalexin Rash     Medication list after today's encounter   Current Outpatient Medications:  .  acetaminophen (TYLENOL) 500 MG tablet, Take 1,000 mg by mouth every 6 (six) hours as needed., Disp: , Rfl:  .  apixaban (ELIQUIS) 5 MG TABS tablet, Take 1 tablet (5 mg total) by mouth 2 (two) times daily., Disp: 60 tablet, Rfl: 6 .  metoprolol succinate (TOPROL-XL) 100 MG 24 hr tablet, Take 1 tablet (100 mg total) by mouth daily. Take with or immediately following a meal., Disp: 90 tablet, Rfl: 3 .  rosuvastatin (CRESTOR) 5 MG tablet, Take 1 tablet (5 mg total) by mouth at bedtime., Disp: 90 tablet, Rfl: 3 .  valsartan-hydrochlorothiazide (DIOVAN-HCT) 320-12.5 MG tablet, 1 tablet, Disp: , Rfl:   Laboratory examination:   Lab Results  Component Value Date   NA 133 (L) 06/08/2021   K 3.6 06/08/2021   CO2 26 06/08/2021   GLUCOSE 95 06/08/2021   BUN 21 06/08/2021   CREATININE 1.22 06/08/2021   CALCIUM 9.4 06/08/2021   GFRNONAA >60 06/08/2021       Latest Ref Rng & Units 06/08/2021    4:46 PM 04/30/2020   11:30 AM 01/20/2013    5:25 AM  CMP  Glucose 70 - 99 mg/dL 95  101  85   BUN 8 - 23 mg/dL '21  31  13   '$ Creatinine 0.61 - 1.24 mg/dL 1.22  1.50  1.31   Sodium 135 - 145 mmol/L 133  138  127   Potassium 3.5 - 5.1 mmol/L 3.6  3.8  3.6   Chloride 98 - 111 mmol/L 99  99  94   CO2 22 - 32 mmol/L 26   24   Calcium 8.9 - 10.3 mg/dL 9.4   8.6       Latest Ref Rng & Units 04/30/2020   11:30 AM 01/19/2013    4:45 AM 01/17/2013    6:20 AM  CBC  WBC 4.0 - 10.5 K/uL  9.9  8.2   Hemoglobin 13.0 - 17.0 g/dL 15.3  12.2  11.1   Hematocrit 39.0 - 52.0 % 45.0  35.1  31.9   Platelets 150 - 400 K/uL  288  225     Lipid Panel No results for input(s): "CHOL", "TRIG", "LDLCALC", "VLDL", "HDL", "CHOLHDL",  "LDLDIRECT" in the last 8760 hours.  HEMOGLOBIN A1C No results found for: "  HGBA1C", "MPG" TSH No results for input(s): "TSH" in the last 8760 hours.  External labs:     Radiology:    Cardiac Studies:   ECHO COMPLETE WITH IMAGING ENHANCING AGENT 01/06/2022  Narrative ECHOCARDIOGRAM REPORT    Patient Name:   [name] Date of Exam: 01/06/2022 Medical Rec #:  [id number]       Height:       70.0 in Accession #:    [id number]      Weight:       169.0 lb Date of Birth:  May 01, 1951        BSA:          1.943 m Patient Age:    63 years        BP:           151/93 mmHg Patient Gender: M               HR:           82 bpm. Exam Location:  [place]  Procedure: 2D Echo, Cardiac Doppler, Color Doppler and Intracardiac Opacification Agent  Indications:    I71.2 Ascending aortic aneurysm  History:        Patient has no prior history of Echocardiogram examinations. Risk Factors:Hypertension. Pneumonia.  Sonographer:    Diamond Nickel RCS Referring Phys: [id number] [place]   1. Dilated ascending aorta - 45 mm. 2. Left ventricular ejection fraction, by estimation, is 60 to 65%. The left ventricle has normal function. The left ventricle has no regional wall motion abnormalities. Left ventricular diastolic parameters are indeterminate. 3. Right ventricular systolic function is normal. The right ventricular size is normal. 4. The mitral valve is normal in structure. Trivial mitral valve regurgitation. No evidence of mitral stenosis. 5. Restricted right coronary cusp. The aortic valve is calcified. There is moderate calcification of the aortic valve. There is moderate thickening of the aortic valve. Aortic valve regurgitation is not visualized. Aortic valve sclerosis/calcification is present, without any evidence of aortic stenosis. 6. Aortic dilatation noted. There is moderate dilatation of the aortic root, measuring 45 mm. There is moderate dilatation of  the ascending aorta, measuring 45 mm. 7. The inferior vena cava is normal in size with greater than 50% respiratory variability, suggesting right atrial pressure of 3 mmHg.  FINDINGS Left Ventricle: Left ventricular ejection fraction, by estimation, is 60 to 65%. The left ventricle has normal function. The left ventricle has no regional wall motion abnormalities. The left ventricular internal cavity size was normal in size. There is no left ventricular hypertrophy. Left ventricular diastolic parameters are indeterminate.  Right Ventricle: The right ventricular size is normal. No increase in right ventricular wall thickness. Right ventricular systolic function is normal.  Left Atrium: Left atrial size was normal in size.  Right Atrium: Right atrial size was normal in size.  Pericardium: There is no evidence of pericardial effusion.  Mitral Valve: The mitral valve is normal in structure. Trivial mitral valve regurgitation. No evidence of mitral valve stenosis.  Tricuspid Valve: The tricuspid valve is normal in structure. Tricuspid valve regurgitation is trivial. No evidence of tricuspid stenosis.  Aortic Valve: Restricted right coronary cusp. The aortic valve is calcified. There is moderate calcification of the aortic valve. There is moderate thickening of the aortic valve. Aortic valve regurgitation is not visualized. Aortic valve sclerosis/calcification is present, without any evidence of aortic stenosis.  Pulmonic Valve: The pulmonic valve was normal in structure. Pulmonic valve regurgitation is  trivial. No evidence of pulmonic stenosis.  Aorta: Aortic dilatation noted. There is moderate dilatation of the aortic root, measuring 45 mm. There is moderate dilatation of the ascending aorta, measuring 45 mm.  Venous: The inferior vena cava is normal in size with greater than 50% respiratory variability, suggesting right atrial pressure of 3 mmHg.  IAS/Shunts: No atrial level shunt detected by  color flow Doppler.  Additional Comments: Dilated ascending aorta - 45 mm.   LEFT VENTRICLE PLAX 2D LVIDd:         4.00 cm   Diastology LVIDs:         2.80 cm   LV e' medial:    8.27 cm/s LV PW:         1.10 cm   LV E/e' medial:  12.7 LV IVS:        1.20 cm   LV e' lateral:   14.70 cm/s LVOT diam:     1.90 cm   LV E/e' lateral: 7.1 LV SV:         90 LV SV Index:   47 LVOT Area:     2.84 cm   RIGHT VENTRICLE RV Basal diam:  2.40 cm RV S prime:     11.20 cm/s TAPSE (M-mode): 1.7 cm  LEFT ATRIUM             Index        RIGHT ATRIUM           Index LA diam:        3.20 cm 1.65 cm/m   RA Area:     11.60 cm LA Vol (A2C):   42.5 ml 21.87 ml/m  RA Volume:   19.40 ml  9.98 ml/m LA Vol (A4C):   39.8 ml 20.48 ml/m LA Biplane Vol: 44.4 ml 22.85 ml/m AORTIC VALVE LVOT Vmax:   146.00 cm/s LVOT Vmean:  103.000 cm/s LVOT VTI:    0.319 m  AORTA Ao Root diam: 4.40 cm Ao Asc diam:  4.50 cm  MITRAL VALVE MV Area (PHT): 3.74 cm     SHUNTS MV Decel Time: 203 msec     Systemic VTI:  0.32 m MV E velocity: 105.00 cm/s  Systemic Diam: 1.90 cm MV A velocity: 96.40 cm/s MV E/A ratio:  1.09  Candee Furbish MD Electronically signed by Candee Furbish MD Signature Date/Time: 01/06/2022/4:57:37 PM    Final     Exercise nuclear stress test 06/20/2022 Myocardial perfusion is normal. Overall LV systolic function is normal without regional wall motion abnormalities. Stress LV EF: 60%. Low risk study. Normal ECG stress. The patient exercised for 1 minutes and 30 seconds of a Bruce protocol, achieving approximately 3.49 METs and 88% MPHR. The heart rate response was normal. The blood pressure response was normal. No previous exam available for comparison.     EKG:   05/25/2022: atrial fibrillation with controlled ventricular response  Assessment     ICD-10-CM   1. Atrial fibrillation, paroxysmal  I48.91 EKG 12-Lead    2. Essential hypertension  I10        Orders Placed This  Encounter  Procedures  . EKG 12-Lead    No orders of the defined types were placed in this encounter.   There are no discontinued medications.    Recommendations:   [name] is a 71 y.o.  male with Paroxysmal Afib   Atrial fibrillation, unspecified type ([place]) CHA2DS2VASc = 2 Will initiate Eliquis Echocardiogram from 12/2021 reviewed Will obtain stress test  Switch coreg to Toprol XL   Essential hypertension Continue diovan-hctz combo Patient did not tolerate amlodipine in the past Encourage low-sodium diet, less than 2000 mg daily.   Personal history of pheochromocytoma Urine and plasma metanephrines ordered Patient had pheo removed ~ 10 years ago   Follow-up in 6 weeks or sooner if needed    Floydene Flock, DO, [facility]  07/08/2022, 10:10 AM Office: [phone] Pager: [phone]

## 2022-07-13 ENCOUNTER — Other Ambulatory Visit: Payer: Self-pay | Admitting: Thoracic Surgery (Cardiothoracic Vascular Surgery)

## 2022-07-13 DIAGNOSIS — I7121 Aneurysm of the ascending aorta, without rupture: Secondary | ICD-10-CM

## 2022-08-04 ENCOUNTER — Ambulatory Visit
Admission: RE | Admit: 2022-08-04 | Discharge: 2022-08-04 | Disposition: A | Discharge status: Home / Self Care | Payer: Medicare Other | Source: Ambulatory Visit | Attending: Thoracic Surgery (Cardiothoracic Vascular Surgery) | Admitting: Thoracic Surgery (Cardiothoracic Vascular Surgery)

## 2022-08-04 DIAGNOSIS — I7121 Aneurysm of the ascending aorta, without rupture: Secondary | ICD-10-CM

## 2022-08-04 DIAGNOSIS — I700 Atherosclerosis of aorta: Secondary | ICD-10-CM | POA: Diagnosis not present

## 2022-08-12 ENCOUNTER — Telehealth: Payer: Medicare Other | Admitting: Thoracic Surgery (Cardiothoracic Vascular Surgery)

## 2022-08-15 ENCOUNTER — Ambulatory Visit (INDEPENDENT_AMBULATORY_CARE_PROVIDER_SITE_OTHER): Payer: Medicare Other | Admitting: Thoracic Surgery (Cardiothoracic Vascular Surgery)

## 2022-08-15 DIAGNOSIS — I7121 Aneurysm of the ascending aorta, without rupture: Secondary | ICD-10-CM | POA: Diagnosis not present

## 2022-08-15 NOTE — Progress Notes (Signed)
[address]       [place] [address]             [phone]       Patient: Home Provider: Office Consent for Telemedicine visit obtained.  Today's visit was completed via a real-time telehealth (see specific modality noted below). The patient/authorized person provided oral consent at the time of the visit to engage in a telemedicine encounter with the present provider at [facility]. The patient/authorized person was informed of the potential benefits, limitations, and risks of telemedicine. The patient/authorized person expressed understanding that the laws that protect confidentiality also apply to telemedicine. The patient/authorized person acknowledged understanding that telemedicine does not provide emergency services and that he or she would need to call 911 or proceed to the nearest hospital for help if such a need arose.   Total time spent in the clinical discussion 10 minutes.  Telehealth Modality: Phone visit (audio only)  Recent Radiology Findings:   CT Chest Wo Contrast  Result Date: 08/04/2022 CLINICAL DATA:  Ascending thoracic aortic aneurysm. EXAM: CT CHEST WITHOUT CONTRAST TECHNIQUE: Multidetector CT imaging of the chest was performed following the standard protocol without IV contrast. RADIATION DOSE REDUCTION: This exam was performed according to the departmental dose-optimization program which includes automated exposure control, adjustment of the mA and/or kV according to patient size and/or use of iterative reconstruction technique. COMPARISON:  02/04/2022 FINDINGS: Cardiovascular: Heart size upper normal. Coronary artery calcification is evident. Ascending thoracic aortic aneurysm is 4.8 cm diameter today compared to 4.9 cm previously. Mild atherosclerotic calcification is noted in the wall of the thoracic aorta. Mediastinum/Nodes: No mediastinal lymphadenopathy. The tiny right pericardial cyst measured previously is similar today on image 94/2.  Calcified lymph nodes in the AP window are compatible with old granulomatous disease. No evidence for gross hilar lymphadenopathy although assessment is limited by the lack of intravenous contrast on the current study. The esophagus has normal imaging features. There is no axillary lymphadenopathy. Lungs/Pleura: No new suspicious pulmonary nodule or mass. Calcified granulomata in the parahilar left lung are stable. No focal airspace consolidation. No pleural effusion. Upper Abdomen: Visualized portion of the upper abdomen is unremarkable. Musculoskeletal: No worrisome lytic or sclerotic osseous abnormality. IMPRESSION: 1. Stable exam. No new or progressive findings. 2. Stable ascending thoracic aortic aneurysm measuring 4.8 cm diameter today compared to 4.9 cm previously. Ascending thoracic aortic aneurysm. Recommend semi-annual imaging followup by CTA or MRA and referral to cardiothoracic surgery if not already obtained. This recommendation follows 2010 ACCF/AHA/AATS/ACR/ASA/SCA/SCAI/SIR/STS/SVM Guidelines for the Diagnosis and Management of Patients With Thoracic Aortic Disease. Circulation. 2010; 121: H371-I967. Aortic aneurysm NOS (ICD10-I71.9) 3. Old granulomatous disease in the left lung and mediastinum. 4.  Aortic Atherosclerosis (ICD10-I70.0). Electronically Signed   By: Misty Stanley M.D.   On: 08/04/2022 09:57    I had a telephone visit with Mr. Zurawski  Assessment: 72yo male with a 4.9 cm ascending aortic aneurysm.  Echocardiogram shows a tricuspid valve without evidence of regurgitation.  We discussed the natural history and and risk factors for growth of ascending aortic aneurysms.  We covered the importance of smoking cessation, tight blood pressure control, refraining from lifting heavy objects, and avoiding fluoroquinolones.  The patient is aware of signs and symptoms of aortic dissection and when to present to the emergency department.  We will continue surveillance and a repeat CT was ordered  for 12 months.  Banjamin Stovall Bary Leriche

## 2022-10-25 ENCOUNTER — Emergency Department (HOSPITAL_COMMUNITY)
Admission: EM | Admit: 2022-10-25 | Discharge: 2022-10-25 | Disposition: A | Discharge status: Home / Self Care | Payer: Medicare Other | Attending: Emergency Medicine | Admitting: Emergency Medicine

## 2022-10-25 ENCOUNTER — Other Ambulatory Visit: Payer: Self-pay

## 2022-10-25 ENCOUNTER — Emergency Department (HOSPITAL_COMMUNITY): Payer: Medicare Other

## 2022-10-25 DIAGNOSIS — R55 Syncope and collapse: Secondary | ICD-10-CM | POA: Diagnosis not present

## 2022-10-25 DIAGNOSIS — Z7901 Long term (current) use of anticoagulants: Secondary | ICD-10-CM | POA: Diagnosis not present

## 2022-10-25 DIAGNOSIS — R079 Chest pain, unspecified: Secondary | ICD-10-CM | POA: Diagnosis not present

## 2022-10-25 LAB — CBC
HCT: 42.6 % (ref 39.0–52.0)
Hemoglobin: 14.7 g/dL (ref 13.0–17.0)
MCH: 31.6 pg (ref 26.0–34.0)
MCHC: 34.5 g/dL (ref 30.0–36.0)
MCV: 91.6 fL (ref 80.0–100.0)
Platelets: 241 10*3/uL (ref 150–400)
RBC: 4.65 MIL/uL (ref 4.22–5.81)
RDW: 12.5 % (ref 11.5–15.5)
WBC: 7.2 10*3/uL (ref 4.0–10.5)
nRBC: 0 % (ref 0.0–0.2)

## 2022-10-25 LAB — BASIC METABOLIC PANEL
Anion gap: 14 (ref 5–15)
BUN: 28 mg/dL — ABNORMAL HIGH (ref 8–23)
CO2: 24 mmol/L (ref 22–32)
Calcium: 9.7 mg/dL (ref 8.9–10.3)
Chloride: 93 mmol/L — ABNORMAL LOW (ref 98–111)
Creatinine, Ser: 1.64 mg/dL — ABNORMAL HIGH (ref 0.61–1.24)
GFR, Estimated: 44 mL/min — ABNORMAL LOW (ref >60)
Glucose, Bld: 123 mg/dL — ABNORMAL HIGH (ref 70–99)
Potassium: 3.8 mmol/L (ref 3.5–5.1)
Sodium: 131 mmol/L — ABNORMAL LOW (ref 135–145)

## 2022-10-25 LAB — PROTIME-INR
INR: 1.1 (ref 0.8–1.2)
Prothrombin Time: 14.3 seconds (ref 11.4–15.2)

## 2022-10-25 LAB — TROPONIN I (HIGH SENSITIVITY): Troponin I (High Sensitivity): 4 ng/L (ref <18)

## 2022-10-25 NOTE — ED Triage Notes (Signed)
Pt was seeing a play with friends and wife at [place] and had a syncopal episode.  Wife states he was sitting in the seat and went "in and out " for about 5 minutes.  Pt states he only remembers feeling thirsty prior to this episode.  Has no complaints at this time.  Pt states he has a diagnosis of a thoracic aneurysm and has afib for which he takes metoprolol.  He sometimes "skips" the metoprolol because it makes him feel bad.

## 2022-10-25 NOTE — ED Provider Notes (Signed)
[facility] Provider Note   CSN: [id number] Arrival date & time: 10/25/22  2059     History  Chief Complaint  Patient presents with   Loss of Consciousness    [name] is a 72 y.o. male.  72 yo M with a chief complaints of a syncopal event.  This happened while he was watching a play.  He became very sweaty and lost consciousness maybe for about 5 minutes.  He denies any specific symptoms now denies headache chest pain abdominal pain back pain.  Has been eating and drinking normally.  He felt a bit thirsty right before it happened.  He has a history of a ascending thoracic aortic aneurysm.  He also has A-fib.  He has been taking metoprolol off and on.   Loss of Consciousness      Home Medications Prior to Admission medications   Medication Sig Start Date End Date Taking? Authorizing Provider  acetaminophen (TYLENOL) 500 MG tablet Take 1,000 mg by mouth every 6 (six) hours as needed.    [provider]  apixaban (ELIQUIS) 5 MG TABS tablet Take 1 tablet (5 mg total) by mouth 2 (two) times daily. 05/25/22   Custovic, Collene Mares, DO  metoprolol succinate (TOPROL-XL) 100 MG 24 hr tablet Take 1 tablet (100 mg total) by mouth daily. Take with or immediately following a meal. 05/25/22 08/23/22  Custovic, Collene Mares, DO  rosuvastatin (CRESTOR) 5 MG tablet Take 1 tablet (5 mg total) by mouth at bedtime. 05/25/22 08/23/22  Custovic, Collene Mares, DO  spironolactone (ALDACTONE) 25 MG tablet Take 0.5 tablets (12.5 mg total) by mouth daily. 07/08/22 10/06/22  Custovic, Collene Mares, DO  valsartan-hydrochlorothiazide (DIOVAN-HCT) 320-12.5 MG tablet 1 tablet    [provider]      Allergies    Sulfa antibiotics and Cephalexin    Review of Systems   Review of Systems  Cardiovascular:  Positive for syncope.    Physical Exam Updated Vital Signs BP 123/86 (BP Location: Right Arm)   Pulse 85   Temp (!) 97.5 F (36.4 C) (Oral)   Resp 20    SpO2 99%  Physical Exam Vitals and nursing note reviewed.  Constitutional:      Appearance: He is well-developed.  HENT:     Head: Normocephalic and atraumatic.  Eyes:     Pupils: Pupils are equal, round, and reactive to light.  Neck:     Vascular: No JVD.  Cardiovascular:     Rate and Rhythm: Normal rate. Rhythm irregular.     Heart sounds: No murmur heard.    No friction rub. No gallop.  Pulmonary:     Effort: No respiratory distress.     Breath sounds: No wheezing.  Abdominal:     General: There is no distension.     Tenderness: There is no abdominal tenderness. There is no guarding or rebound.  Musculoskeletal:        General: Normal range of motion.     Cervical back: Normal range of motion and neck supple.  Skin:    Coloration: Skin is not pale.     Findings: No rash.  Neurological:     Mental Status: He is alert and oriented to person, place, and time.  Psychiatric:        Behavior: Behavior normal.     ED Results / Procedures / Treatments   Labs (all labs ordered are listed, but only abnormal results are displayed) Labs Reviewed  BASIC METABOLIC  PANEL - Abnormal; Notable for the following components:      Result Value   Sodium 131 (*)    Chloride 93 (*)    Glucose, Bld 123 (*)    BUN 28 (*)    Creatinine, Ser 1.64 (*)    GFR, Estimated 44 (*)    All other components within normal limits  CBC  PROTIME-INR  TROPONIN I (HIGH SENSITIVITY)    EKG EKG Interpretation  Date/Time:  Tuesday October 25 2022 20:59:15 EDT Ventricular Rate:  83 PR Interval:    QRS Duration: 86 QT Interval:  376 QTC Calculation: 441 R Axis:   19 Text Interpretation: Atrial fibrillation vs sinus arrythmia Abnormal ECG difficulty to identify obvious p waves Otherwise no significant change Confirmed by Deno Etienne [phone]) on 10/25/2022 9:45:38 PM  Radiology DG Chest 2 View  Result Date: 10/25/2022 CLINICAL DATA:  Chest pain. EXAM: CHEST - 2 VIEW COMPARISON:  January 16, 2013.  FINDINGS: The heart size and mediastinal contours are within normal limits. Both lungs are clear. The visualized skeletal structures are unremarkable. IMPRESSION: No active cardiopulmonary disease. Electronically Signed   By: Marijo Conception M.D.   On: 10/25/2022 21:26    Procedures Procedures    Medications Ordered in ED Medications - No data to display  ED Course/ Medical Decision Making/ A&P                             Medical Decision Making Amount and/or Complexity of Data Reviewed Labs: ordered. Radiology: ordered.   72 yo M with a chief complaint of a syncopal event.  Sounds vasovagal by history.  The concerning part of his history is that he has a ascending aortic aneurysm.  I would suspect that he would have some other symptoms other than isolated syncope with this.  He has no murmurs on exam.  Equal distal pulses.  No anemia.  Chest x-ray independently interpreted by me without a widened mediastinum, no focal infiltrate and pneumothorax.  I had a discussion with the patient.  Shared decision making at bedside.  Electing not for CT angiogram at this time.  Labwork with dehydration, offered fluids which patient is declining at this time.  PCP follow-up.  10:38 PM:  I have discussed the diagnosis/risks/treatment options with the patient.  Evaluation and diagnostic testing in the emergency department does not suggest an emergent condition requiring admission or immediate intervention beyond what has been performed at this time.  They will follow up with PCP. We also discussed returning to the ED immediately if new or worsening sx occur. We discussed the sx which are most concerning (e.g., sudden worsening pain, fever, inability to tolerate by mouth) that necessitate immediate return. Medications administered to the patient during their visit and any new prescriptions provided to the patient are listed below.  Medications given during this visit Medications - No data to  display   The patient appears reasonably screen and/or stabilized for discharge and I doubt any other medical condition or other [facility] requiring further screening, evaluation, or treatment in the ED at this time prior to discharge.          Final Clinical Impression(s) / ED Diagnoses Final diagnoses:  Syncope and collapse    Rx / DC Orders ED Discharge Orders     None         Deno Etienne, DO 10/25/22 2238

## 2022-10-25 NOTE — Discharge Instructions (Signed)
Your lab work suggests you are dehydrated.  Try to increase your fluid intake at home.  Follow up with your PCP and cardiologist.   Return for chest pain, back pain, recurrent event where you pass out.

## 2022-10-25 NOTE — ED Notes (Signed)
Pt to room from waiting room. Pt denies complaints. Pt hooked up to monitors. Vitals stable. Call bell within reach. No other acute distress noted at this time. MD at bedside

## 2022-11-23 ENCOUNTER — Ambulatory Visit: Payer: Medicare Other | Admitting: Internal Medicine

## 2022-11-23 ENCOUNTER — Encounter: Payer: Self-pay | Admitting: Internal Medicine

## 2022-11-23 ENCOUNTER — Other Ambulatory Visit: Payer: Medicare Other

## 2022-11-23 VITALS — BP 138/90 | HR 50 | Ht 70.0 in | Wt 177.8 lb

## 2022-11-23 DIAGNOSIS — I4891 Unspecified atrial fibrillation: Secondary | ICD-10-CM

## 2022-11-23 DIAGNOSIS — R55 Syncope and collapse: Secondary | ICD-10-CM

## 2022-11-23 DIAGNOSIS — I7121 Aneurysm of the ascending aorta, without rupture: Secondary | ICD-10-CM

## 2022-11-23 MED ORDER — DILTIAZEM HCL ER COATED BEADS 120 MG PO CP24: 120.0000 mg | ORAL_CAPSULE | Freq: Every day | ORAL | 3 refills | Status: DC

## 2022-11-23 NOTE — Progress Notes (Signed)
Primary Physician/Referring:  Emilio Aspen, MD  Patient ID: [name], male    DOB: 24-Dec-1950, 72 y.o.   MRN: [id number]  Chief Complaint  Patient presents with   Atrial Fibrillation   Loss of Consciousness   HPI:    [name]  is a 72 y.o. 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   On last visit, patient was started on spirolactone 25 mg. He states he was at a play sitting down and had a syncopal episode in his chair for about 5 min. He went to the ER and they felt that he was severely dehydrated. He does admit to drinking alcohol that night as well. He never felt any chest pain/dizziness/lightheadedness. He has not had any symptoms since.   He does complain of his fingers and toes being extremely cold and concerned for raynaud's. He feels that this started around the time he started his metoprolol and admits to taking 50mg  instead of the 100mg . He has not had any issues with the spirolactone, and notes his home BP to range around 120-130s. He does state his heart rate is noted to be in the 40s at times.     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   Concussion    x 2 yrs ago no residual   Gout    Hypertension    Pneumonia yrs ago   Right hydrocele    Past Surgical History:  Procedure Laterality Date   COLONOSCOPY     Hx: of   ESOPHAGOGASTRODUODENOSCOPY N/A 10/24/2012   Procedure: ESOPHAGOGASTRODUODENOSCOPY (EGD);  Surgeon: Willis Modena, MD;  Location: [facility];  Service: Endoscopy;  Laterality: N/A;   EUS N/A 10/24/2012   Procedure: ESOPHAGEAL ENDOSCOPIC ULTRASOUND (EUS) RADIAL;  Surgeon: Willis Modena, MD;  Location: [facility];  Service: Endoscopy;  Laterality: N/A;  + or - FNA   FINE NEEDLE ASPIRATION N/A 10/24/2012   Procedure:  FINE NEEDLE ASPIRATION (FNA) LINEAR;  Surgeon: Willis Modena, MD;  Location: [facility];  Service: Endoscopy;  Laterality: N/A;   HYDROCELE EXCISION Right 04/30/2020   Procedure: HYDROCELECTOMY ADULT;  Surgeon: Marcine Matar, MD;  Location: [facility];  Service: Urology;  Laterality: Right;   INGUINAL HERNIA REPAIR  1985   right   LAPAROSCOPIC LIVER ULTRASOUND N/A 01/10/2013   Procedure: LAPAROSCOPIC LIVER ULTRASOUND;  Surgeon: [name], MD;  Location[facility];  Service: General;  Laterality: N/A;   LAPAROSCOPY N/A 01/10/2013   Procedure: LAPAROSCOPY DIAGNOSTIC;  Surgeon: [name], MD;  Location: [facility] OR;  Service: General;  Laterality: N/A;   RETROPERITONEAL MASS EXCISION  01/10/2013   TONSILLECTOMY  as child   Family History  Problem Relation Age of Onset   Heart disease Father     Social History   Tobacco Use   Smoking status: Never   Smokeless tobacco: Never  Substance Use Topics   Alcohol use: Yes    Comment: 2- per day   Marital Status: Married  ROS  Review of Systems  Constitutional: Negative.  Cardiovascular: Negative.  Negative for chest pain, dyspnea on exertion, leg swelling and palpitations.  Respiratory: Negative.    Endocrine: Positive for cold intolerance.  Neurological: Negative.   Psychiatric/Behavioral: Negative.     Objective  Blood pressure (!) 138/90, pulse Marland Kitchen)  50, height  (1.778 m), weight 177 lb 12.8 oz (80.6 kg), SpO2 96 %. Body mass index is 25.51 kg/m.     11/23/2022   11:03 AM 10/25/2022   10:08 PM 10/25/2022    9:06 PM  Vitals with BMI  Height     Weight 177 lbs 13 oz    BMI 25.51    Systolic 138 123 161  Diastolic 90 86 82  Pulse 50 85 78     Physical Exam Vitals reviewed.  HENT:     Head: Normocephalic and atraumatic.  Cardiovascular:     Rate and Rhythm: Normal rate. Rhythm irregular.     Pulses: Normal pulses.     Heart sounds: Normal heart sounds. No murmur heard.    No gallop.  Pulmonary:      Effort: Pulmonary effort is normal.     Breath sounds: Normal breath sounds.  Abdominal:     General: Bowel sounds are normal.  Musculoskeletal:     Right hand: Normal.     Left hand: Normal.     Right lower leg: No edema.     Left lower leg: No edema.     Comments: Fingers are purple/white discoloration, cold to touch.   Skin:    General: Skin is warm and dry.     Capillary Refill: Capillary refill takes less than 2 seconds.  Neurological:     General: No focal deficit present.     Mental Status: He is alert.  Psychiatric:        Mood and Affect: Mood normal.     Medications and allergies   Allergies  Allergen Reactions   Sulfa Antibiotics Itching   Cephalexin Rash     Medication list after today's encounter   Current Outpatient Medications:    acetaminophen (TYLENOL) 500 MG tablet, Take 1,000 mg by mouth every 6 (six) hours as needed., Disp: , Rfl:    apixaban (ELIQUIS) 5 MG TABS tablet, Take 1 tablet (5 mg total) by mouth 2 (two) times daily., Disp: 60 tablet, Rfl: 6   diltiazem (CARDIZEM CD) 120 MG 24 hr capsule, Take 1 capsule (120 mg total) by mouth daily., Disp: 90 capsule, Rfl: 3   rosuvastatin (CRESTOR) 5 MG tablet, Take 1 tablet (5 mg total) by mouth at bedtime., Disp: 90 tablet, Rfl: 3   spironolactone (ALDACTONE) 25 MG tablet, Take 0.5 tablets (12.5 mg total) by mouth daily., Disp: 90 tablet, Rfl: 3   valsartan-hydrochlorothiazide (DIOVAN-HCT) 320-12.5 MG tablet, 1 tablet, Disp: , Rfl:   Laboratory examination:   Lab Results  Component Value Date   NA 131 (L) 10/25/2022   K 3.8 10/25/2022   CO2 24 10/25/2022   GLUCOSE 123 (H) 10/25/2022   BUN 28 (H) 10/25/2022   CREATININE 1.64 (H) 10/25/2022   CALCIUM 9.7 10/25/2022   GFRNONAA 44 (L) 10/25/2022       Latest Ref Rng & Units 10/25/2022    9:15 PM 06/08/2021    4:46 PM 04/30/2020   11:30 AM  CMP  Glucose 70 - 99 mg/dL 096  95  045   BUN 8 - 23 mg/dL Creatinine 0.61 - 1.24 mg/dL  4.09  8.11  9.14   Sodium 135 - 145 mmol/L 131  133  138   Potassium 3.5 - 5.1 mmol/L 3.8  3.6  3.8   Chloride 98 - 111 mmol/L 93  99  99   CO2 22 - 32  mmol/L 24  26    Calcium 8.9 - 10.3 mg/dL 9.7  9.4        Latest Ref Rng & Units 10/25/2022    9:15 PM 04/30/2020   11:30 AM 01/19/2013    4:45 AM  CBC  WBC 4.0 - 10.5 K/uL 7.2   9.9   Hemoglobin 13.0 - 17.0 g/dL 16.1  09.6  04.5   Hematocrit 39.0 - 52.0 % 42.6  45.0  35.1   Platelets 150 - 400 K/uL 241   288     Lipid Panel No results for input(s): "CHOL", "TRIG", "LDLCALC", "VLDL", "HDL", "CHOLHDL", "LDLDIRECT" in the last 8760 hours.  HEMOGLOBIN A1C No results found for: "HGBA1C", "MPG" TSH No results for input(s): "TSH" in the last 8760 hours.  External labs:     Radiology:    Cardiac Studies:    CT Chest Without Contrast 08/04/2022  IMPRESSION: 1. Stable exam. No new or progressive findings. 2. Stable ascending thoracic aortic aneurysm measuring 4.8 cm diameter today compared to 4.9 cm previously. Ascending thoracic aortic aneurysm. Recommend semi-annual imaging followup by CTA or MRA and referral to cardiothoracic surgery if not already obtained. This recommendation follows 2010 ACCF/AHA/AATS/ACR/ASA/SCA/SCAI/SIR/STS/SVM Guidelines for the Diagnosis and Management of Patients With Thoracic Aortic Disease. Circulation. 2010; 121: W098-J191. Aortic aneurysm NOS (ICD10-I71.9) 3. Old granulomatous disease in the left lung and mediastinum. 4.  Aortic Atherosclerosis (ICD10-I70.0).    ECHO COMPLETE WITH IMAGING ENHANCING AGENT 01/06/2022  Narrative ECHOCARDIOGRAM REPORT    Patient Name:   TUDOR CHANDLEY Date of Exam: 01/06/2022 Medical Rec #:  [id number]       Height:       70.0 in Accession #:    [id number]      Weight:       169.0 lb Date of Birth:  1951-04-01        BSA:          1.943 m Patient Age:    71 years        BP:           151/93 mmHg Patient Gender: M               HR:           82 bpm. Exam  Location:  [address]  Procedure: 2D Echo, Cardiac Doppler, Color Doppler and Intracardiac Opacification Agent  Indications:    I71.2 Ascending aortic aneurysm  History:        Patient has no prior history of Echocardiogram examinations. Risk Factors:Hypertension. Pneumonia.  Sonographer:    Cathie Beams RCS Referring Phys: [id number] HARRELL O LIGHTFOOT  IMPRESSIONS   1. Dilated ascending aorta - 45 mm. 2. Left ventricular ejection fraction, by estimation, is 60 to 65%. The left ventricle has normal function. The left ventricle has no regional wall motion abnormalities. Left ventricular diastolic parameters are indeterminate. 3. Right ventricular systolic function is normal. The right ventricular size is normal. 4. The mitral valve is normal in structure. Trivial mitral valve regurgitation. No evidence of mitral stenosis. 5. Restricted right coronary cusp. The aortic valve is calcified. There is moderate calcification of the aortic valve. There is moderate thickening of the aortic valve. Aortic valve regurgitation is not visualized. Aortic valve sclerosis/calcification is present, without any evidence of aortic stenosis. 6. Aortic dilatation noted. There is moderate dilatation of the aortic root, measuring 45 mm. There is moderate dilatation of the ascending aorta, measuring 45 mm. 7. The inferior vena cava is normal  in size with greater than 50% respiratory variability, suggesting right atrial pressure of 3 mmHg.  FINDINGS Left Ventricle: Left ventricular ejection fraction, by estimation, is 60 to 65%. The left ventricle has normal function. The left ventricle has no regional wall motion abnormalities. The left ventricular internal cavity size was normal in size. There is no left ventricular hypertrophy. Left ventricular diastolic parameters are indeterminate.  Right Ventricle: The right ventricular size is normal. No increase in right ventricular wall thickness. Right ventricular  systolic function is normal.  Left Atrium: Left atrial size was normal in size.  Right Atrium: Right atrial size was normal in size.  Pericardium: There is no evidence of pericardial effusion.  Mitral Valve: The mitral valve is normal in structure. Trivial mitral valve regurgitation. No evidence of mitral valve stenosis.  Tricuspid Valve: The tricuspid valve is normal in structure. Tricuspid valve regurgitation is trivial. No evidence of tricuspid stenosis.  Aortic Valve: Restricted right coronary cusp. The aortic valve is calcified. There is moderate calcification of the aortic valve. There is moderate thickening of the aortic valve. Aortic valve regurgitation is not visualized. Aortic valve sclerosis/calcification is present, without any evidence of aortic stenosis.  Pulmonic Valve: The pulmonic valve was normal in structure. Pulmonic valve regurgitation is trivial. No evidence of pulmonic stenosis.  Aorta: Aortic dilatation noted. There is moderate dilatation of the aortic root, measuring 45 mm. There is moderate dilatation of the ascending aorta, measuring 45 mm.  Venous: The inferior vena cava is normal in size with greater than 50% respiratory variability, suggesting right atrial pressure of 3 mmHg.  IAS/Shunts: No atrial level shunt detected by color flow Doppler.  Additional Comments: Dilated ascending aorta - 45 mm.   LEFT VENTRICLE PLAX 2D LVIDd:         4.00 cm   Diastology LVIDs:         2.80 cm   LV e' medial:    8.27 cm/s LV PW:         1.10 cm   LV E/e' medial:  12.7 LV IVS:        1.20 cm   LV e' lateral:   14.70 cm/s LVOT diam:     1.90 cm   LV E/e' lateral: 7.1 LV SV:         90 LV SV Index:   47 LVOT Area:     2.84 cm   RIGHT VENTRICLE RV Basal diam:  2.40 cm RV S prime:     11.20 cm/s TAPSE (M-mode): 1.7 cm  LEFT ATRIUM             Index        RIGHT ATRIUM           Index LA diam:        3.20 cm 1.65 cm/m   RA Area:     11.60 cm LA Vol (A2C):    42.5 ml 21.87 ml/m  RA Volume:   19.40 ml  9.98 ml/m LA Vol (A4C):   39.8 ml 20.48 ml/m LA Biplane Vol: 44.4 ml 22.85 ml/m AORTIC VALVE LVOT Vmax:   146.00 cm/s LVOT Vmean:  103.000 cm/s LVOT VTI:    0.319 m  AORTA Ao Root diam: 4.40 cm Ao Asc diam:  4.50 cm  MITRAL VALVE MV Area (PHT): 3.74 cm     SHUNTS MV Decel Time: 203 msec     Systemic VTI:  0.32 m MV E velocity: 105.00 cm/s  Systemic Diam: 1.90 cm  MV A velocity: 96.40 cm/s MV E/A ratio:  1.09  Nathan Schultz MD Electronically signed by Nathan Schultz MD Signature Date/Time: 01/06/2022/4:57:37 PM    Final     Exercise nuclear stress test 06/20/2022 Myocardial perfusion is normal. Overall LV systolic function is normal without regional wall motion abnormalities. Stress LV EF: 60%. Low risk study. Normal ECG stress. The patient exercised for 1 minutes and 30 seconds of a Bruce protocol, achieving approximately 3.49 METs and 88% MPHR. The heart rate response was normal. The blood pressure response was normal. No previous exam available for comparison.     EKG:   05/25/2022: atrial fibrillation with controlled ventricular response  07/08/2022: Atrial fibrillation with controlled ventricular response  Assessment     ICD-10-CM   1. Atrial fibrillation, unspecified type  I48.91 EKG 12-Lead    [facility] ECHOCARDIOGRAM COMPLETE    LONG TERM MONITOR (3-14 DAYS)    2. Syncope and collapse  R55 [facility] ECHOCARDIOGRAM COMPLETE    LONG TERM MONITOR (3-14 DAYS)    3. Aneurysm of the ascending aorta, without rupture  I71.21        Orders Placed This Encounter  Procedures   LONG TERM MONITOR (3-14 DAYS)    Standing Status:   Future    Number of Occurrences:   1    Standing Expiration Date:   11/23/2023    Order Specific Question:   Where should this test be performed?    Answer:   [facility]    Order Specific Question:   Does the patient have an implanted cardiac device?    Answer:   No    Order Specific Question:    Prescribed days of wear    Answer:   68    Order Specific Question:   Type of enrollment    Answer:   Clinic Enrollment    Order Specific Question:   Release to patient    Answer:   Immediate   EKG 12-Lead   [facility] ECHOCARDIOGRAM COMPLETE    Standing Status:   Future    Standing Expiration Date:   11/23/2023    Meds ordered this encounter  Medications   diltiazem (CARDIZEM CD) 120 MG 24 hr capsule    Sig: Take 1 capsule (120 mg total) by mouth daily.    Dispense:  90 capsule    Refill:  3    Medications Discontinued During This Encounter  Medication Reason   metoprolol succinate (TOPROL-XL) 100 MG 24 hr tablet       Recommendations:   ROLLEN SELDERS is a 72 y.o.  male with Paroxysmal Afib   Syncope  Likely secondary to dehydration, however, given hx of afib, can not exclude other cardiac arrhthymias.  Event monitor  Atrial fibrillation, unspecified type (HCC) CHA2DS2VASc = 2 Continue Eliquis Will stop metoprolol  Start Cardizem 120 mg   Essential hypertension Continue diovan-hctz combo Patient did not tolerate amlodipine in the past Continue spironolactone 25mg   Start Cardizem 120mg   Encouraged to make BP log  Encourage low-sodium diet, less than 2000 mg daily.  Aneurysm of the Ascending Aorta without rupture Last Echo in 2023 with 4.9 cm ascending aortic aneurysm  Will repeat Echo  Patient has seen Dr Cliffton Asters and obtained CT in January 2024 and will undergo annual CT  Personal history of pheochromocytoma Urine and plasma metanephrines within normal limits  Follow-up in 6 months or sooner if needed   Royston Bake, NP-S  Patient seen with Royston Bake, NP-S. I independently reviewed  the chart and examined the patient. I agree with assessment & plan as documented above.     Clotilde Dieter, DO  11/23/2022, 12:07 PM Office: [phone] Pager: [phone]

## 2022-12-13 DIAGNOSIS — R55 Syncope and collapse: Secondary | ICD-10-CM | POA: Diagnosis not present

## 2022-12-13 DIAGNOSIS — I4891 Unspecified atrial fibrillation: Secondary | ICD-10-CM | POA: Diagnosis not present

## 2022-12-19 DIAGNOSIS — I4891 Unspecified atrial fibrillation: Secondary | ICD-10-CM | POA: Diagnosis not present

## 2022-12-19 DIAGNOSIS — R55 Syncope and collapse: Secondary | ICD-10-CM | POA: Diagnosis not present

## 2022-12-27 ENCOUNTER — Other Ambulatory Visit: Payer: Medicare Other

## 2022-12-27 ENCOUNTER — Ambulatory Visit: Payer: Medicare Other | Admitting: Internal Medicine

## 2023-01-05 ENCOUNTER — Ambulatory Visit: Payer: Medicare Other | Admitting: Internal Medicine

## 2023-01-08 ENCOUNTER — Other Ambulatory Visit: Payer: Self-pay | Admitting: Internal Medicine

## 2023-01-17 ENCOUNTER — Ambulatory Visit: Payer: Medicare Other

## 2023-01-17 DIAGNOSIS — R55 Syncope and collapse: Secondary | ICD-10-CM

## 2023-01-17 DIAGNOSIS — I4891 Unspecified atrial fibrillation: Secondary | ICD-10-CM | POA: Diagnosis not present

## 2023-01-24 ENCOUNTER — Ambulatory Visit: Payer: Medicare Other | Admitting: Cardiology

## 2023-01-27 ENCOUNTER — Ambulatory Visit: Payer: Medicare Other | Admitting: Cardiology

## 2023-01-27 ENCOUNTER — Encounter: Payer: Self-pay | Admitting: Cardiology

## 2023-01-27 VITALS — BP 116/77 | HR 76 | Ht 70.0 in | Wt 175.0 lb

## 2023-01-27 DIAGNOSIS — I739 Peripheral vascular disease, unspecified: Secondary | ICD-10-CM

## 2023-01-27 DIAGNOSIS — I4819 Other persistent atrial fibrillation: Secondary | ICD-10-CM | POA: Diagnosis not present

## 2023-01-27 DIAGNOSIS — R0683 Snoring: Secondary | ICD-10-CM

## 2023-01-27 DIAGNOSIS — I10 Essential (primary) hypertension: Secondary | ICD-10-CM

## 2023-01-27 DIAGNOSIS — I700 Atherosclerosis of aorta: Secondary | ICD-10-CM

## 2023-01-27 NOTE — Progress Notes (Signed)
ID:  AADISH TOWNSEND, DOB 04/06/1951, MRN [id number]  PCP:  Emilio Aspen, MD  Cardiologist:  Tessa Lerner, DO, [facility] (established care 01/27/23) Former Cardiology Providers: Dr. Clotilde Dieter  Date: 01/27/23 Last Office Visit: 11/23/2022  Chief Complaint  Patient presents with   Atrial fibrillation, unspecified type [facility])    HPI  [name] is a 72 y.o. 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  Patient is being followed by the practice for atrial fibrillation.  He was under the care of of Dr. Rozell Searing Custovic and I am seeing him for the first time.  Patient is accompanied by his wife at today's office visit.  Atrial fibrillation: According to the patient/wife it was discovered in September 2023.  He has been on AV nodal blocking agents for rate control strategy.  Has not undergone cardioversion or atrial fibrillation ablation.  Overall his rate is very well-controlled on current regimen.  But he questions if he could be considered for rhythm management as well.  He recently also had a syncopal event likely secondary to dehydration, alcohol consumption.  Since then he has been discontinued from beta-blockers and transition to calcium channel blockers.  He had a Zio patch for further evaluation of dysrhythmias.  Zio patch data reviewed which notes predominant rhythm to be A-fib without other significant dysrhythmias.  Otherwise patient is doing well from a cardiovascular standpoint he denies chest pain or heart failure symptoms.  With prolonged ambulation he sometimes experiences lower extremity swelling/pain.  No formal diagnosis of peripheral artery disease.  He has not been evaluated for sleep apnea in the past but there is a good clinical suspicion  based on family members.  He consumes approximately 3 standard drinks per day.  CARDIAC DATABASE: EKG: January 27, 2023: Atrial fibrillation, 79 bpm.  Echocardiogram: 01/17/2023: Left ventricle cavity is normal in size. Normal left ventricular wall thickness. Normal global wall motion. Normal LV systolic function with EF 68%. Unable to evaluate diastolic function due to atrial fibrillation. Left atrial cavity is moderately dilated at 48.3 ml/m^2. Trileaflet aortic valve. Trace aortic regurgitation. Mild mitral valve leaflet thickening. Mild (Grade I) mitral regurgitation. The aortic root is dilated, measuring 4.2 cm at sinus of Valsalva. Dilated ascending aorta, measuring 4.1 cm. IVC is dilated with respiratory variation. Estimated right atrial pressure 8 mmHg. No evidence of pulmonary hypertension.  01/06/2022 1. Dilated ascending aorta - 45 mm. 2. Left ventricular ejection fraction, by estimation, is 60 to 65%. The left ventricle has normal function. The left ventricle has no regional wall motion abnormalities. Left ventricular diastolic parameters are indeterminate. 3. Right ventricular systolic function is normal. The right ventricular size is normal. 4. The mitral valve is normal in structure. Trivial mitral valve regurgitation. No evidence of mitral stenosis. 5. Restricted right coronary cusp. The aortic valve is calcified. There is moderate calcification of the aortic valve. There is moderate thickening of the aortic valve. Aortic valve regurgitation is not visualized. Aortic valve sclerosis/calcification is present, without any evidence of aortic stenosis. 6. Aortic dilatation noted. There is moderate dilatation of the aortic root, measuring 45 mm. There is moderate dilatation of the ascending aorta, measuring 45 mm. 7. The inferior vena cava is normal in size with greater than 50% respiratory variability, suggesting right atrial pressure of 3 mmHg.  Stress Testing: Exercise nuclear  stress test 06/20/2022 Myocardial perfusion is normal. Overall LV systolic function is normal without regional wall motion abnormalities. Stress LV EF: 60%. Low risk study. Normal ECG stress. The patient exercised for 1 minutes and 30 seconds of a Bruce protocol, achieving approximately 3.49 METs and 88% MPHR. The heart rate response was normal. The blood pressure response was normal. No previous exam available for comparison.  Zio Patch:  Results reviewed final report forthcoming    RADIOLOGY: CT ANGIO CHEST AORTA W/CM & OR WO/CM 02/04/2022 1. Slight interval increase in size of the fusiform ascending aortic aneurysm which now measures 4.9 cm previously 4.6 cm. Ascending thoracic aortic aneurysm. Recommend semi-annual imaging followup by CTA or MRA and referral to cardiothoracic surgery if not already obtained. This recommendation follows 2010 ACCF/AHA/AATS/ACR/ASA/SCA/SCAI/SIR/STS/SVM Guidelines for the Diagnosis and Management of Patients With Thoracic Aortic Disease. Circulation. 2010; 121: W098-J191. Aortic aneurysm NOS (ICD10-I71.9) 2. 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 3.  Aortic Atherosclerosis (ICD10-I70.0).   ALLERGIES: Allergies  Allergen Reactions   Sulfa Antibiotics Itching   Cephalexin Rash    MEDICATION LIST PRIOR TO VISIT: Current Meds  Medication Sig   acetaminophen (TYLENOL) 500 MG tablet Take 1,000 mg by mouth every 6 (six) hours as needed.   diltiazem (CARDIZEM CD) 120 MG 24 hr capsule Take 1 capsule (120 mg total) by mouth daily.   ELIQUIS 5 MG TABS tablet TAKE 1 TABLET(5 MG) BY MOUTH TWICE DAILY   rosuvastatin (CRESTOR) 5 MG tablet Take 1 tablet (5 mg total) by mouth at bedtime.   spironolactone (ALDACTONE) 25 MG tablet Take 0.5 tablets (12.5 mg total) by mouth daily.   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   Concussion    x 2 yrs ago no residual   Gout    Hypertension    Pneumonia yrs ago   Right hydrocele     PAST SURGICAL HISTORY: Past Surgical History:  Procedure Laterality Date   COLONOSCOPY     Hx: of   ESOPHAGOGASTRODUODENOSCOPY N/A 10/24/2012   Procedure: ESOPHAGOGASTRODUODENOSCOPY (EGD);  Surgeon: Willis Modena, MD;  Location: [facility];  Service: Endoscopy;  Laterality: N/A;   EUS N/A 10/24/2012   Procedure: ESOPHAGEAL ENDOSCOPIC ULTRASOUND (EUS) RADIAL;  Surgeon: Willis Modena, MD;  Location: [facility];  Service: Endoscopy;  Laterality: N/A;  + or - FNA   FINE NEEDLE ASPIRATION N/A 10/24/2012   Procedure: FINE NEEDLE ASPIRATION (FNA) LINEAR;  Surgeon: Willis Modena, MD;  Location: [facility];  Service: Endoscopy;  Laterality: N/A;   HYDROCELE EXCISION Right 04/30/2020   Procedure: HYDROCELECTOMY ADULT;  Surgeon: Marcine Matar, MD;  Location: [facility];  Service: Urology;  Laterality: Right;   INGUINAL HERNIA REPAIR  1985   right   LAPAROSCOPIC LIVER ULTRASOUND N/A 01/10/2013   Procedure: LAPAROSCOPIC LIVER ULTRASOUND;  Surgeon: [name], MD;  Location[facility];  Service: General;  Laterality: N/A;   LAPAROSCOPY N/A 01/10/2013   Procedure: LAPAROSCOPY DIAGNOSTIC;  Surgeon: [name], MD;  Location: [facility] OR;  Service: General;  Laterality: N/A;   RETROPERITONEAL MASS EXCISION  01/10/2013   TONSILLECTOMY  as child    FAMILY HISTORY: The patient family history includes Heart disease in his father.  SOCIAL HISTORY:  The patient  reports that he has never smoked. He has never used smokeless tobacco. He  reports current alcohol use. He reports that he does not use drugs.  REVIEW OF SYSTEMS: Review of Systems  Cardiovascular:  Positive for claudication. Negative for chest pain, dyspnea on exertion, irregular heartbeat, leg swelling, near-syncope, orthopnea, palpitations, paroxysmal  nocturnal dyspnea and syncope.  Respiratory:  Positive for cough. Negative for shortness of breath.   Hematologic/Lymphatic: Negative for bleeding problem.  Musculoskeletal:  Negative for muscle cramps and myalgias.  Neurological:  Negative for dizziness and light-headedness.    PHYSICAL EXAM:    01/27/2023    9:01 AM 11/23/2022   11:03 AM 10/25/2022   10:08 PM  Vitals with BMI  Height 5\' 10"  5\' 10"    Weight 175 lbs 177 lbs 13 oz   BMI 25.11 25.51   Systolic 116 138 098  Diastolic 77 90 86  Pulse 76 50 85    Physical Exam  Constitutional: No distress.  Age appropriate, hemodynamically stable.   Neck: No JVD present.  Cardiovascular: Normal rate, S1 normal, S2 normal, intact distal pulses and normal pulses. An irregularly irregular rhythm present. Exam reveals no gallop, no S3 and no S4.  No murmur heard. Pulmonary/Chest: Effort normal and breath sounds normal. No stridor. He has no wheezes. He has no rales.  Abdominal: Soft. Bowel sounds are normal. He exhibits no distension. There is no abdominal tenderness.  Musculoskeletal:        General: No edema.     Cervical back: Neck supple.  Neurological: He is alert and oriented to person, place, and time. He has intact cranial nerves (2-12).  Skin: Skin is warm and moist.     LABORATORY DATA:    Latest Ref Rng & Units 10/25/2022    9:15 PM 04/30/2020   11:30 AM 01/19/2013    4:45 AM  CBC  WBC 4.0 - 10.5 K/uL 7.2   9.9   Hemoglobin 13.0 - 17.0 g/dL 11.9  14.7  82.9   Hematocrit 39.0 - 52.0 % 42.6  45.0  35.1   Platelets 150 - 400 K/uL 241   288        Latest Ref Rng & Units 10/25/2022    9:15 PM 06/08/2021    4:46 PM 04/30/2020   11:30 AM  CMP  Glucose 70 - 99 mg/dL 562  95  130   BUN 8 - 23 mg/dL 28  21  31    Creatinine 0.61 - 1.24 mg/dL 8.65  7.84  6.96   Sodium 135 - 145 mmol/L 131  133  138   Potassium 3.5 - 5.1 mmol/L 3.8  3.6  3.8   Chloride 98 - 111 mmol/L 93  99  99   CO2 22 - 32 mmol/L 24  26    Calcium 8.9 -  10.3 mg/dL 9.7  9.4      No results found for: "CHOL", "HDL", "LDLCALC", "LDLDIRECT", "TRIG", "CHOLHDL" No components found for: "NTPROBNP" No results for input(s): "PROBNP" in the last 8760 hours. No results for input(s): "TSH" in the last 8760 hours.  BMP Recent Labs    10/25/22 2115  NA 131*  K 3.8  CL 93*  CO2 24  GLUCOSE 123*  BUN 28*  CREATININE 1.64*  CALCIUM 9.7  GFRNONAA 44*    HEMOGLOBIN A1C No results found for: "HGBA1C", "MPG"  IMPRESSION:    ICD-10-CM   1. Persistent atrial fibrillation (HCC)  I48.19 EKG 12-Lead    Ambulatory referral to Sleep Studies    2. Essential hypertension  I10  3. Atherosclerosis of aorta (HCC)  I70.0     4. Claudication (HCC)  I73.9 PCV LOWER ARTERIAL (BILATERAL)    5. Snoring  R06.83 Ambulatory referral to Sleep Studies       RECOMMENDATIONS: [name] is a 72 y.o. 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  Persistent atrial fibrillation (HCC) Discovered in September 2023. Currently on rate control strategy. Tolerating anticoagulation well without any evidence of bleeding. No prior history of cardioversion, antiarrhythmic meds, or ablation. Reemphasized the importance of reducing alcohol intake-no more than 2 standard beverages per day. Will refer to sleep medicine for evaluation for sleep apnea Click Here to Calculate/Change CHADS2VASc Score The patient's CHADS2-VASc score is 3, indicating a 3.2% annual risk of stroke.   CHF History: No HTN History: Yes Diabetes History: No Stroke History: No Vascular Disease History: Yes Patient is interested in proceeding forward direct-current cardioversion.  He and his wife understand that prior to direct-current cardioversion he has to be on  anticoagulation uninterrupted for 4 weeks before procedure and 4 weeks after the procedure  Informed Consent   Shared Decision Making/Informed Consent The risks (stroke, cardiac arrhythmias rarely resulting in the need for a temporary or permanent pacemaker, skin irritation or burns and complications associated with conscious sedation including aspiration, arrhythmia, respiratory failure and death), benefits (restoration of normal sinus rhythm) and alternatives of a direct current cardioversion were explained in detail to [name] and he agrees to proceed.  However, I have upcoming vacations planned for July and August and will let us know as per their schedule.    Essential hypertension Office blood pressures are well-controlled. No changes warranted at this time.  Atherosclerosis of aorta (HCC) Currently on statin therapy. Echo and stress test results reviewed as part of medical decision making at today's office visit. Monitor for now  Claudication [facility]) With prolonged ambulation, patient complains of diffuse bilateral lower extremity pain as well as swelling. Both DP and PT pulses are weak to palpate but present. Will proceed with an arterial duplex to evaluate for PAD  Snoring His wife endorses significant amount of snoring at night.  In the addition of atrial fibrillation and clinical concerns for sleep apnea will refer him to sleep medicine for further evaluation  FINAL MEDICATION LIST END OF ENCOUNTER: No orders of the defined types were placed in this encounter.   There are no discontinued medications.   Current Outpatient Medications:    acetaminophen (TYLENOL) 500 MG tablet, Take 1,000 mg by mouth every 6 (six) hours as needed., Disp: , Rfl:    diltiazem (CARDIZEM CD) 120 MG 24 hr capsule, Take 1 capsule (120 mg total) by mouth daily., Disp: 90 capsule, Rfl: 3   ELIQUIS 5 MG TABS tablet, TAKE 1 TABLET(5 MG) BY MOUTH TWICE DAILY, Disp: 60 tablet, Rfl: 6   rosuvastatin  (CRESTOR) 5 MG tablet, Take 1 tablet (5 mg total) by mouth at bedtime., Disp: 90 tablet, Rfl: 3   spironolactone (ALDACTONE) 25 MG tablet, Take 0.5 tablets (12.5 mg total) by mouth daily., Disp: 90 tablet, Rfl: 3   valsartan-hydrochlorothiazide (DIOVAN-HCT) 320-12.5 MG tablet, 1 tablet, Disp: , Rfl:   Orders Placed This Encounter  Procedures   Ambulatory referral to Sleep Studies   EKG 12-Lead   PCV LOWER ARTERIAL (BILATERAL)    There are no Patient Instructions on  file for this visit.   --Continue cardiac medications as reconciled in final medication list. --Return in about 3 months (around 04/29/2023) for Follow up, A. fib. or sooner if needed. --Continue follow-up with your primary care physician regarding the management of your other chronic comorbid conditions.  Patient's questions and concerns were addressed to his satisfaction. He voices understanding of the instructions provided during this encounter.   This note was created using a voice recognition software as a result there may be grammatical errors inadvertently enclosed that do not reflect the nature of this encounter. Every attempt is made to correct such errors.  Tessa Lerner, [place], [facility]  Pager:  [phone] Office: [phone]

## 2023-02-10 DIAGNOSIS — I7121 Aneurysm of the ascending aorta, without rupture: Secondary | ICD-10-CM | POA: Diagnosis not present

## 2023-02-10 DIAGNOSIS — J329 Chronic sinusitis, unspecified: Secondary | ICD-10-CM | POA: Diagnosis not present

## 2023-02-10 DIAGNOSIS — N1831 Chronic kidney disease, stage 3a: Secondary | ICD-10-CM | POA: Diagnosis not present

## 2023-02-10 DIAGNOSIS — J309 Allergic rhinitis, unspecified: Secondary | ICD-10-CM | POA: Diagnosis not present

## 2023-02-10 DIAGNOSIS — R053 Chronic cough: Secondary | ICD-10-CM | POA: Diagnosis not present

## 2023-02-10 DIAGNOSIS — I700 Atherosclerosis of aorta: Secondary | ICD-10-CM | POA: Diagnosis not present

## 2023-02-10 DIAGNOSIS — I4819 Other persistent atrial fibrillation: Secondary | ICD-10-CM | POA: Diagnosis not present

## 2023-02-10 DIAGNOSIS — R600 Localized edema: Secondary | ICD-10-CM | POA: Diagnosis not present

## 2023-02-10 DIAGNOSIS — I10 Essential (primary) hypertension: Secondary | ICD-10-CM | POA: Diagnosis not present

## 2023-02-10 DIAGNOSIS — D6869 Other thrombophilia: Secondary | ICD-10-CM | POA: Diagnosis not present

## 2023-02-10 DIAGNOSIS — I739 Peripheral vascular disease, unspecified: Secondary | ICD-10-CM | POA: Diagnosis not present

## 2023-02-22 DIAGNOSIS — J329 Chronic sinusitis, unspecified: Secondary | ICD-10-CM | POA: Diagnosis not present

## 2023-02-22 DIAGNOSIS — I700 Atherosclerosis of aorta: Secondary | ICD-10-CM | POA: Diagnosis not present

## 2023-02-22 DIAGNOSIS — R600 Localized edema: Secondary | ICD-10-CM | POA: Diagnosis not present

## 2023-02-22 DIAGNOSIS — Z79899 Other long term (current) drug therapy: Secondary | ICD-10-CM | POA: Diagnosis not present

## 2023-02-22 DIAGNOSIS — I10 Essential (primary) hypertension: Secondary | ICD-10-CM | POA: Diagnosis not present

## 2023-02-22 DIAGNOSIS — E785 Hyperlipidemia, unspecified: Secondary | ICD-10-CM | POA: Diagnosis not present

## 2023-02-22 DIAGNOSIS — Z86010 Personal history of colonic polyps: Secondary | ICD-10-CM | POA: Diagnosis not present

## 2023-02-22 DIAGNOSIS — I4819 Other persistent atrial fibrillation: Secondary | ICD-10-CM | POA: Diagnosis not present

## 2023-02-22 DIAGNOSIS — I7121 Aneurysm of the ascending aorta, without rupture: Secondary | ICD-10-CM | POA: Diagnosis not present

## 2023-02-22 DIAGNOSIS — D6869 Other thrombophilia: Secondary | ICD-10-CM | POA: Diagnosis not present

## 2023-02-22 DIAGNOSIS — G629 Polyneuropathy, unspecified: Secondary | ICD-10-CM | POA: Diagnosis not present

## 2023-02-22 DIAGNOSIS — Z0000 Encounter for general adult medical examination without abnormal findings: Secondary | ICD-10-CM | POA: Diagnosis not present

## 2023-02-22 DIAGNOSIS — N1831 Chronic kidney disease, stage 3a: Secondary | ICD-10-CM | POA: Diagnosis not present

## 2023-02-23 ENCOUNTER — Ambulatory Visit: Payer: Medicare Other | Admitting: Cardiology

## 2023-03-07 NOTE — Anesthesia Preprocedure Evaluation (Signed)
Anesthesia Evaluation  Patient identified by MRN, date of birth, ID band Patient awake    Reviewed: Allergy & Precautions, NPO status , Patient's Chart, lab work & pertinent test results  History of Anesthesia Complications Negative for: history of anesthetic complications  Airway Mallampati: II  TM Distance: >3 FB Neck ROM: Full    Dental  (+) Dental Advisory Given, Teeth Intact   Pulmonary neg pulmonary ROS   Pulmonary exam normal        Cardiovascular hypertension, Pt. on medications + dysrhythmias Atrial Fibrillation  Rhythm:Irregular Rate:Normal   '24 TTE - EF 68%. Left atrial cavity is moderately dilated at 48.3 ml/m^2. Trace aortic regurgitation. Mild (Grade I) mitral regurgitation. The aortic root is dilated, measuring 4.2 cm at sinus of Valsalva. Dilated ascending aorta, measuring 4.1 cm.      Neuro/Psych negative neurological ROS  negative psych ROS   GI/Hepatic negative GI ROS, Neg liver ROS,,,  Endo/Other   Hx pheochromocytoma s/p removal   Renal/GU negative Renal ROS     Musculoskeletal negative musculoskeletal ROS (+)    Abdominal   Peds  Hematology  On eliquis    Anesthesia Other Findings   Reproductive/Obstetrics                             Anesthesia Physical Anesthesia Plan  ASA: 3  Anesthesia Plan: General   Post-op Pain Management: Minimal or no pain anticipated   Induction: Intravenous  PONV Risk Score and Plan: 2 and Treatment may vary due to age or medical condition and Propofol infusion  Airway Management Planned: Natural Airway and Mask  Additional Equipment: None  Intra-op Plan:   Post-operative Plan:   Informed Consent: I have reviewed the patients History and Physical, chart, labs and discussed the procedure including the risks, benefits and alternatives for the proposed anesthesia with the patient or authorized representative who has  indicated his/her understanding and acceptance.       Plan Discussed with: CRNA and Anesthesiologist  Anesthesia Plan Comments:        Anesthesia Quick Evaluation

## 2023-03-07 NOTE — Pre-Procedure Instructions (Signed)
Spoke to patient on phone regarding procedure tomorrow.  Instructed to arrive at 730 am, NPO after midnight.  Confirmed patient has a ride home and responsible person to stay with patient for 24 hours after the procedure  Confirmed no missed doses of Eliquis, instructed patient to take the morning of surgery with a sip of water.

## 2023-03-08 ENCOUNTER — Ambulatory Visit (HOSPITAL_COMMUNITY)
Admission: RE | Admit: 2023-03-08 | Discharge: 2023-03-08 | Disposition: A | Discharge status: Home / Self Care | Payer: Medicare Other | Attending: Cardiology | Admitting: Cardiology

## 2023-03-08 ENCOUNTER — Ambulatory Visit (HOSPITAL_COMMUNITY): Payer: Medicare Other | Admitting: Anesthesiology

## 2023-03-08 ENCOUNTER — Encounter (HOSPITAL_COMMUNITY)
Admission: RE | Disposition: A | Discharge status: Home / Self Care | Payer: Self-pay | Source: Home / Self Care | Attending: Cardiology

## 2023-03-08 ENCOUNTER — Encounter (HOSPITAL_COMMUNITY): Payer: Self-pay | Admitting: Cardiology

## 2023-03-08 ENCOUNTER — Other Ambulatory Visit: Payer: Self-pay

## 2023-03-08 ENCOUNTER — Ambulatory Visit (HOSPITAL_BASED_OUTPATIENT_CLINIC_OR_DEPARTMENT_OTHER): Payer: Medicare Other | Admitting: Anesthesiology

## 2023-03-08 DIAGNOSIS — I700 Atherosclerosis of aorta: Secondary | ICD-10-CM | POA: Diagnosis not present

## 2023-03-08 DIAGNOSIS — I4891 Unspecified atrial fibrillation: Secondary | ICD-10-CM

## 2023-03-08 DIAGNOSIS — I4819 Other persistent atrial fibrillation: Secondary | ICD-10-CM | POA: Diagnosis not present

## 2023-03-08 DIAGNOSIS — F1090 Alcohol use, unspecified, uncomplicated: Secondary | ICD-10-CM | POA: Insufficient documentation

## 2023-03-08 DIAGNOSIS — I10 Essential (primary) hypertension: Secondary | ICD-10-CM | POA: Diagnosis not present

## 2023-03-08 DIAGNOSIS — E871 Hypo-osmolality and hyponatremia: Secondary | ICD-10-CM | POA: Diagnosis not present

## 2023-03-08 DIAGNOSIS — I7121 Aneurysm of the ascending aorta, without rupture: Secondary | ICD-10-CM | POA: Diagnosis not present

## 2023-03-08 DIAGNOSIS — I712 Thoracic aortic aneurysm, without rupture, unspecified: Secondary | ICD-10-CM | POA: Diagnosis not present

## 2023-03-08 HISTORY — PX: CARDIOVERSION: SHX1299

## 2023-03-08 HISTORY — DX: Unspecified atrial fibrillation: I48.91

## 2023-03-08 LAB — POCT I-STAT, CHEM 8
BUN: 28 mg/dL — ABNORMAL HIGH (ref 8–23)
Calcium, Ion: 1.25 mmol/L (ref 1.15–1.40)
Chloride: 93 mmol/L — ABNORMAL LOW (ref 98–111)
Creatinine, Ser: 1.3 mg/dL — ABNORMAL HIGH (ref 0.61–1.24)
Glucose, Bld: 106 mg/dL — ABNORMAL HIGH (ref 70–99)
HCT: 43 % (ref 39.0–52.0)
Hemoglobin: 14.6 g/dL (ref 13.0–17.0)
Potassium: 4.8 mmol/L (ref 3.5–5.1)
Sodium: 129 mmol/L — ABNORMAL LOW (ref 135–145)
TCO2: 29 mmol/L (ref 22–32)

## 2023-03-08 SURGERY — CARDIOVERSION
Anesthesia: General

## 2023-03-08 MED ORDER — SODIUM CHLORIDE 0.9 % IV SOLN: INTRAVENOUS | Status: DC

## 2023-03-08 MED ORDER — LIDOCAINE HCL (PF) 2 % IJ SOLN
INTRAMUSCULAR | Status: DC | PRN
  Administered 2023-03-08: 60 mg via INTRADERMAL

## 2023-03-08 MED ORDER — PROPOFOL 500 MG/50ML IV EMUL
INTRAVENOUS | Status: DC | PRN
  Administered 2023-03-08: 100 mg via INTRAVENOUS

## 2023-03-08 MED ORDER — ONDANSETRON HCL 4 MG/2ML IJ SOLN: INTRAMUSCULAR | Status: DC | PRN

## 2023-03-08 SURGICAL SUPPLY — 1 items: ELECT DEFIB PAD ADLT CADENCE (PAD) ×1 IMPLANT

## 2023-03-08 NOTE — Anesthesia Postprocedure Evaluation (Signed)
Anesthesia Post Note  Patient: [name]  Procedure(s) Performed: CARDIOVERSION     Patient location during evaluation: PACU Anesthesia Type: General Level of consciousness: awake and alert Pain management: pain level controlled Vital Signs Assessment: post-procedure vital signs reviewed and stable Respiratory status: spontaneous breathing, nonlabored ventilation and respiratory function stable Cardiovascular status: stable and blood pressure returned to baseline Anesthetic complications: no   No notable events documented.  Last Vitals:  Vitals:   03/08/23 0900 03/08/23 0903  BP:  (!) 126/90  Pulse: 63   Resp: 11   Temp:    SpO2: 96%     Last Pain:  Vitals:   03/08/23 0855  TempSrc:   PainSc: 0-No pain                 Beryle Lathe

## 2023-03-08 NOTE — Discharge Instructions (Signed)
Electrical Cardioversion Electrical cardioversion is the delivery of a jolt of electricity to restore a normal rhythm to the heart. A rhythm that is too fast or is not regular (arrhythmia) keeps the heart from pumping blood well. There is also another type of cardioversion called a chemical (pharmacologic) cardioversion. This is when your health care provider gives you one or more medicines to bring back your regular heart rhythm. Electrical cardioversion is done as a scheduled procedure for arrhythmiasthat are not life-threatening. Electrical cardioversion may also be done in an emergency for sudden life-threatening arrhythmias. Tell a health care provider about: Any allergies you have. All medicines you are taking, including vitamins, herbs, eye drops, creams, and over-the-counter medicines. Any problems you or family members have had with sedatives or anesthesia. Any bleeding problems you have. Any surgeries you have had, including a pacemaker, defibrillator, or other implanted device. Any medical conditions you have. Whether you are pregnant or may be pregnant. What are the risks? Your provider will talk with you about risks. These include: Allergic reactions to medicines. Irritation to the skin on your chest or back where the sticky pads (electrodes) or paddles were put during electrical cardioversion. A blood clot that breaks free and travels to other parts of your body, such as your brain. Return of a worse abnormal heart rhythm that will need to be treated with medicines, a pacemaker, or an implantable cardioverter defibrillator (ICD). What happens before the procedure? Medicines Your provider may give you: Blood-thinning medicines (anticoagulants) so your blood does not clot as easily. If your provider gives you this medicine, you may need to take it for 4 weeks before the procedure. Medicines to help stabilize your heart rate and rhythm. Ask your provider about: Changing or stopping  your regular medicines. These include any diabetes medicines or blood thinners you take. Taking medicines such as aspirin and ibuprofen. These medicines can thin your blood. Do not take them unless your provider tells you to. Taking over-the-counter medicines, vitamins, herbs, and supplements. General instructions Follow instructions from your provider about what you may eat and drink. Do not put any lotions, powders, or ointments on your chest and back for 24 hours before the procedure. They can cause problems with the electrodes or paddles used to deliver electricity to your heart. Do not wear jewelry as this can interfere with delivering electricity to your heart. If you will be going home right after the procedure, plan to have a responsible adult: Take you home from the hospital or clinic. You will not be allowed to drive. Care for you for the time you are told. Tests You may have an exam or testing. This may include: Blood labs. A transesophageal echocardiogram (TEE). What happens during the procedure?     An IV will be inserted into one of your veins. You will be given a sedative. This helps you relax. Electrodes or metal paddles will be placed on your chest. They may be placed in one of these ways: One placed on your right chest, the other on the left ribs. One placed on your chest and the other on your back. An electrical shock will be delivered. The shock briefly stops (resets) your heart rhythm. Your provider will check to see if your heart rhythm is now normal. Some people need only one shock. Some need more to restore a normal heart rhythm. The procedure may vary among providers and hospitals. What happens after the procedure? Your blood pressure, heart rate, breathing rate, and blood oxygen  level will be monitored until you leave the hospital or clinic. Your heart rhythm will be watched to make sure it does not change. This information is not intended to replace advice  given to you by your health care provider. Make sure you discuss any questions you have with your health care provider. Document Revised: 03/10/2022 Document Reviewed: 03/10/2022 Elsevier Patient Education  2024 [employer].

## 2023-03-08 NOTE — CV Procedure (Signed)
Direct current cardioversion:  Indications:  Atrial Fibrillation  Procedure Details:  Consent: Risks of procedure as well as the alternatives and risks of each were explained to the (patient/caregiver).  Consent for procedure obtained.  Time Out: Verified patient identification, verified procedure, site/side was marked, verified correct patient position, special equipment/implants available, medications/allergies/relevent history reviewed, required imaging and test results available. PERFORMED.  Patient placed on cardiac monitor, pulse oximetry, supplemental oxygen as necessary.  Sedation given:  see anesthesia records Pacer pads placed anterior and posterior chest.  Cardioverted 2 time(s).  Cardioversion with synchronized biphasic successful shock. 1st attempt: 100J 2nd attempt: 200J  Evaluation: Findings: Post procedure EKG shows: NSR Complications: None Patient did tolerate procedure well.  [name] 03/08/2023, 8:38 AM

## 2023-03-08 NOTE — H&P (Signed)
HISTORY AND PHYSICAL  Patient ID: [name] MRN: [id number] DOB/AGE: 1951/05/01 72 y.o.  Admit date: 03/08/2023 Attending physician: Tessa Lerner, DO Primary Physician:  Emilio Aspen, MD  Chief complaint: elective cardioversion  HPI:  [name] is a 72 y.o. 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  Last seen in office on 01/27/2023 see the note for more details.   Patient has been on [facility] for the last 4 weeks without interruption.    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   Concussion    x 2 yrs ago no residual   Gout    Hypertension    Pneumonia yrs ago   Right hydrocele     PAST SURGICAL HISTORY: Past Surgical History:  Procedure Laterality Date   COLONOSCOPY     Hx: of   ESOPHAGOGASTRODUODENOSCOPY N/A 10/24/2012   Procedure: ESOPHAGOGASTRODUODENOSCOPY (EGD);  Surgeon: Willis Modena, MD;  Location: [facility];  Service: Endoscopy;  Laterality: N/A;   EUS N/A 10/24/2012   Procedure: ESOPHAGEAL ENDOSCOPIC ULTRASOUND (EUS) RADIAL;  Surgeon: Willis Modena, MD;  Location: [facility];  Service: Endoscopy;  Laterality: N/A;  + or - FNA   FINE NEEDLE ASPIRATION N/A 10/24/2012   Procedure: FINE NEEDLE ASPIRATION (FNA) LINEAR;  Surgeon: Willis Modena, MD;  Location: [facility];  Service: Endoscopy;  Laterality: N/A;   HYDROCELE EXCISION Right 04/30/2020   Procedure: HYDROCELECTOMY ADULT;  Surgeon: Marcine Matar, MD;  Location: [facility];  Service: Urology;   Laterality: Right;   INGUINAL HERNIA REPAIR  1985   right   LAPAROSCOPIC LIVER ULTRASOUND N/A 01/10/2013   Procedure: LAPAROSCOPIC LIVER ULTRASOUND;  Surgeon: [name], MD;  Location: [facility] OR;  Service: General;  Laterality: N/A;   LAPAROSCOPY N/A 01/10/2013   Procedure: LAPAROSCOPY DIAGNOSTIC;  Surgeon: [name], MD;  Location: [facility] OR;  Service: General;  Laterality: N/A;   RETROPERITONEAL MASS EXCISION  01/10/2013   TONSILLECTOMY  as child    FAMILY HISTORY: The patient family history includes Heart disease in his father.   SOCIAL HISTORY:  The patient  reports that he has never smoked. He has never used smokeless tobacco. He reports current alcohol use. He reports that he does not use drugs.  MEDICATIONS: Current Outpatient Medications  Medication Instructions   acetaminophen (TYLENOL) 500-1,000 mg, Oral, Every 6 hours PRN   calcium carbonate (TUMS - DOSED IN MG ELEMENTAL CALCIUM) 500 MG chewable tablet 1 tablet, Oral, Daily PRN   diltiazem (CARDIZEM CD) 120 mg, Oral, Daily   ELIQUIS 5 MG TABS tablet TAKE 1 TABLET(5 MG) BY MOUTH TWICE DAILY   fexofenadine (ALLEGRA) 180 mg, Oral, Daily   fluticasone (FLONASE) 50 MCG/ACT nasal spray 2 sprays, Each Nare, Daily   rosuvastatin (CRESTOR) 5 mg, Oral, Every evening   spironolactone (ALDACTONE) 12.5 mg, Oral, Daily   valsartan-hydrochlorothiazide (DIOVAN-HCT) 320-12.5 MG tablet 1 tablet, Oral, Daily     sodium chloride      REVIEW OF SYSTEMS: Review of Systems  Cardiovascular:  Negative for chest pain, claudication, dyspnea on exertion, irregular heartbeat, leg  swelling, near-syncope, orthopnea, palpitations, paroxysmal nocturnal dyspnea and syncope.  Respiratory:  Negative for shortness of breath.   Hematologic/Lymphatic: Negative for bleeding problem.  Musculoskeletal:  Negative for muscle cramps and myalgias.  Neurological:  Negative for dizziness and light-headedness.    PHYSICAL EXAM:    03/08/2023    8:00 AM 03/08/2023     7:40 AM 01/27/2023    9:01 AM  Vitals with BMI  Height  5\' 10"  5\' 10"   Weight  172 lbs 175 lbs  BMI  24.68 25.11  Systolic 128 118 161  Diastolic 89 88 77  Pulse 55 59 76    No intake or output data in the 24 hours ending 03/08/23 0828  Net IO Since Admission: No IO data has been entered for this period [03/08/23 0828] CONSTITUTIONAL: Well-developed and well-nourished. No acute distress.  SKIN: Skin is warm and dry. No rash noted. No cyanosis. No pallor. No jaundice HEAD: Normocephalic and atraumatic.  EYES: No scleral icterus MOUTH/THROAT: Moist oral membranes.  NECK: No JVD present. No thyromegaly noted. No carotid bruits  LYMPHATIC: No visible cervical adenopathy.  CHEST Normal respiratory effort. No intercostal retractions  LUNGS: Clear to auscultation bilaterally. No stridor. No wheezes. No rales.  CARDIOVASCULAR: irregularly, irregular, s1 s2  ABDOMINAL: No apparent ascites.  EXTREMITIES: No peripheral edema  HEMATOLOGIC: No significant bruising NEUROLOGIC: Oriented to person, place, and time. Nonfocal. Normal muscle tone.  PSYCHIATRIC: Normal mood and affect. Normal behavior. Cooperative  RADIOLOGY: No results found.  LABORATORY DATA: Lab Results  Component Value Date   WBC 7.2 10/25/2022   HGB 14.6 03/08/2023   HCT 43.0 03/08/2023   MCV 91.6 10/25/2022   PLT 241 10/25/2022    Recent Labs  Lab 03/08/23 0816  NA 129*  K 4.8  CL 93*  BUN 28*  CREATININE 1.30*  GLUCOSE 106*    Lipid Panel  No results found for: "CHOL", "HDL", "LDLCALC", "LDLDIRECT", "TRIG", "CHOLHDL"  BNP (last 3 results) No results for input(s): "BNP" in the last 8760 hours.  HEMOGLOBIN A1C No results found for: "HGBA1C", "MPG"  Cardiac Panel (last 3 results) No results for input(s): "CKTOTAL", "CKMB", "RELINDX" in the last 8760 hours.  Invalid input(s): "TROPONINHS"  No results found for: "CKTOTAL", "CKMB", "CKMBINDEX"   TSH No results for input(s): "TSH" in the last 8760  hours.    CARDIAC DATABASE: EKG: January 27, 2023: Atrial fibrillation, 79 bpm.   Echocardiogram: 01/17/2023: Left ventricle cavity is normal in size. Normal left ventricular wall thickness. Normal global wall motion. Normal LV systolic function with EF 68%. Unable to evaluate diastolic function due to atrial fibrillation. Left atrial cavity is moderately dilated at 48.3 ml/m^2. Trileaflet aortic valve. Trace aortic regurgitation. Mild mitral valve leaflet thickening. Mild (Grade I) mitral regurgitation. The aortic root is dilated, measuring 4.2 cm at sinus of Valsalva. Dilated ascending aorta, measuring 4.1 cm. IVC is dilated with respiratory variation. Estimated right atrial pressure 8 mmHg. No evidence of pulmonary hypertension.   01/06/2022 1. Dilated ascending aorta - 45 mm. 2. Left ventricular ejection fraction, by estimation, is 60 to 65%. The left ventricle has normal function. The left ventricle has no regional wall motion abnormalities. Left ventricular diastolic parameters are indeterminate. 3. Right ventricular systolic function is normal. The right ventricular size is normal. 4. The mitral valve is normal in structure. Trivial mitral valve regurgitation. No evidence of mitral stenosis. 5. Restricted right coronary cusp. The aortic valve is calcified. There is moderate calcification of the aortic valve.  There is moderate thickening of the aortic valve. Aortic valve regurgitation is not visualized. Aortic valve sclerosis/calcification is present, without any evidence of aortic stenosis. 6. Aortic dilatation noted. There is moderate dilatation of the aortic root, measuring 45 mm. There is moderate dilatation of the ascending aorta, measuring 45 mm. 7. The inferior vena cava is normal in size with greater than 50% respiratory variability, suggesting right atrial pressure of 3 mmHg.   Stress Testing: Exercise nuclear stress test 06/20/2022 Myocardial perfusion is normal. Overall LV  systolic function is normal without regional wall motion abnormalities. Stress LV EF: 60%. Low risk study. Normal ECG stress. The patient exercised for 1 minutes and 30 seconds of a Bruce protocol, achieving approximately 3.49 METs and 88% MPHR. The heart rate response was normal. The blood pressure response was normal. No previous exam available for comparison.   IMPRESSION & RECOMMENDATIONS: [name] is a 72 y.o. 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  Impression:  Persistent Afib  Hyponatremia - chronic  HTN ETOH use Aortic atherosclerosis Ascending aortic aneurysm (followed by CT surgery)   Plan:  Persistent Afib  Here for planned cardioversion  No interruption in anticoagulation.  Risks, benefits, and alternatives of direct current cardioversion reviewed with the and patient. Risk includes but not limited to: potential for post-cardioversion rhythms, life-threatening arrhythmias (ventricular tachycardia and fibrillation, profound bradycardia, cardiac arrest), myocardial damage, acute pulmonary edema, skin burns, transient hypotension. Benefits include restoration of sinus rhythm. Alternatives to treatment were discussed, questions were answered, patient voices understanding and provides verbal feedback.  Patient is willing to proceed.   Labs reviewed.    Consultants: None   Code Status: Full     Family Communication:  Wife here     Disposition Plan: Home after procedure.     Patient's questions and concerns were addressed to his satisfaction. He voices understanding of the instructions provided during this encounter.   This note was created using a voice recognition software as a result there may be grammatical errors inadvertently enclosed  that do not reflect the nature of this encounter. Every attempt is made to correct such errors.  Delilah Shan [facility]  Pager: [phone] Office: [phone] 03/08/2023, 8:28 AM

## 2023-03-08 NOTE — Transfer of Care (Signed)
Immediate Anesthesia Transfer of Care Note  Patient: [name]  Procedure(s) Performed: CARDIOVERSION  Patient Location: PACU and Cath Lab  Anesthesia Type:MAC  Level of Consciousness: sedated  Airway & Oxygen Therapy: Patient Spontanous Breathing  Post-op Assessment: Report given to RN  Post vital signs: stable  Last Vitals:  Vitals Value Taken Time  BP    Temp    Pulse 59 03/08/23 0824  Resp 17 03/08/23 0824  SpO2 90 % 03/08/23 0824  Vitals shown include unfiled device data.  Last Pain:  Vitals:   03/08/23 0757  TempSrc:   PainSc: 0-No pain         Complications: No notable events documented.

## 2023-03-09 ENCOUNTER — Ambulatory Visit (INDEPENDENT_AMBULATORY_CARE_PROVIDER_SITE_OTHER): Payer: Medicare Other | Admitting: Otolaryngology

## 2023-03-09 ENCOUNTER — Telehealth: Payer: Self-pay

## 2023-03-09 ENCOUNTER — Encounter (HOSPITAL_COMMUNITY): Payer: Self-pay | Admitting: Cardiology

## 2023-03-09 VITALS — BP 128/77 | HR 73

## 2023-03-09 DIAGNOSIS — J3089 Other allergic rhinitis: Secondary | ICD-10-CM

## 2023-03-09 DIAGNOSIS — H903 Sensorineural hearing loss, bilateral: Secondary | ICD-10-CM | POA: Diagnosis not present

## 2023-03-09 DIAGNOSIS — H9193 Unspecified hearing loss, bilateral: Secondary | ICD-10-CM

## 2023-03-09 DIAGNOSIS — R0981 Nasal congestion: Secondary | ICD-10-CM | POA: Diagnosis not present

## 2023-03-09 DIAGNOSIS — H6122 Impacted cerumen, left ear: Secondary | ICD-10-CM | POA: Diagnosis not present

## 2023-03-09 DIAGNOSIS — H608X3 Other otitis externa, bilateral: Secondary | ICD-10-CM

## 2023-03-09 DIAGNOSIS — J329 Chronic sinusitis, unspecified: Secondary | ICD-10-CM | POA: Diagnosis not present

## 2023-03-09 DIAGNOSIS — R0982 Postnasal drip: Secondary | ICD-10-CM | POA: Diagnosis not present

## 2023-03-09 NOTE — Progress Notes (Signed)
ENT CONSULT:  Reason for Consult: post-nasal drainage, nasal congestion, allergy sx and decreased hearing  HPI: [name] is an 71 y.o. male with hx of AAA (4.8) He was started on Allegra and Flonase x 3 weeks ago. He feels that it is helping with sneezing and coughing he has had before. He continues to clear his throat. Left ear has always been worse, than the right. He wears hearing aids from [employer]. Has a hard time with regular conversations. He denies otalgia. He thinks his ears are clogged up with wax. He has mild soreness in front of his left ear a couple of months ago, but now resolved. No ear or sinus surgeries. He had Audiogram 3-4 months ago, with outside Audiologist, who reset the settings on his [employer] hearing aids. It helped for a bit, but now he cannot hear. He denies tinnitus. He had cardioversion yesterday and was told he might be able to stop Eliquis he takes for Afib. He thinks he lost his hearing when he was   Records Reviewed:  ED visit 10/25/22  Records Reviewed:  ED visit note 10/25/22 - seen for syncopal even which was deemed to be vasovagal. Had negative CXR at the time  72 yo M with a chief complaints of a syncopal event.  This happened while he was watching a play.  He became very sweaty and lost consciousness maybe for about 5 minutes.  He denies any specific symptoms now denies headache chest pain abdominal pain back pain.  Has been eating and drinking normally.  He felt a bit thirsty right before it happened.   He has a history of a ascending thoracic aortic aneurysm.  He also has A-fib.  He has been taking metoprolol off and on.    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   Concussion    x 2 yrs ago no residual   Gout    Hypertension    Pneumonia yrs ago   Right hydrocele     Past Surgical History:  Procedure Laterality Date   CARDIOVERSION N/A 03/08/2023   Procedure:  CARDIOVERSION;  Surgeon: Tessa Lerner, DO;  Location: [facility];  Service: Cardiovascular;  Laterality: N/A;   COLONOSCOPY     Hx: of   ESOPHAGOGASTRODUODENOSCOPY N/A 10/24/2012   Procedure: ESOPHAGOGASTRODUODENOSCOPY (EGD);  Surgeon: Willis Modena, MD;  Location: [facility];  Service: Endoscopy;  Laterality: N/A;   EUS N/A 10/24/2012   Procedure: ESOPHAGEAL ENDOSCOPIC ULTRASOUND (EUS) RADIAL;  Surgeon: Willis Modena, MD;  Location: [facility];  Service: Endoscopy;  Laterality: N/A;  + or - FNA   FINE NEEDLE ASPIRATION N/A 10/24/2012   Procedure: FINE NEEDLE ASPIRATION (FNA) LINEAR;  Surgeon: Willis Modena, MD;  Location: [facility];  Service: Endoscopy;  Laterality: N/A;   HYDROCELE EXCISION Right 04/30/2020   Procedure: HYDROCELECTOMY ADULT;  Surgeon: Marcine Matar, MD;  Location: [facility];  Service: Urology;  Laterality: Right;   INGUINAL HERNIA REPAIR  1985   right   LAPAROSCOPIC LIVER ULTRASOUND N/A 01/10/2013   Procedure: LAPAROSCOPIC LIVER ULTRASOUND;  Surgeon: [name], MD;  Location: [facility] OR;  Service: General;  Laterality: N/A;   LAPAROSCOPY N/A 01/10/2013   Procedure: LAPAROSCOPY DIAGNOSTIC;  Surgeon: [name], MD;  Location: [facility] OR;  Service: General;  Laterality: N/A;   RETROPERITONEAL MASS EXCISION  01/10/2013   TONSILLECTOMY  as child  Family History  Problem Relation Age of Onset   Heart disease Father     Social History:  reports that he has never smoked. He has never used smokeless tobacco. He reports current alcohol use. He reports that he does not use drugs.  Allergies:  Allergies  Allergen Reactions   Carvedilol     Other Reaction(s): bradycardia and raynauds   Sulfa Antibiotics Itching   Cephalexin Rash   Levofloxacin Rash    Medications: I have reviewed the patient's current medications.  The PMH, PSH, Medications, Allergies, and SH were reviewed and updated.  ROS: Constitutional: Negative for fever, weight  loss and weight gain. Cardiovascular: Negative for chest pain and dyspnea on exertion. Respiratory: Is not experiencing shortness of breath at rest. Gastrointestinal: Negative for nausea and vomiting. Neurological: Negative for headaches. Psychiatric: The patient is not nervous/anxious  Blood pressure 128/77, pulse 73, SpO2 96%.  PHYSICAL EXAM:  Exam: General: Well-developed, well-nourished Respiratory Respiratory effort: Equal inspiration and expiration without stridor Cardiovascular Peripheral Vascular: Warm extremities with equal color/perfusion Eyes: No nystagmus with equal extraocular motion bilaterally Neuro/Psych/Balance: Patient oriented to person, place, and time; Appropriate mood and affect; Gait is intact with no imbalance; Cranial nerves I-XII are intact Head and Face Inspection: Normocephalic and atraumatic without mass or lesion Palpation: Facial skeleton intact without bony stepoffs Salivary Glands: No mass or tenderness Facial Strength: Facial motility symmetric and full bilaterally ENT Pinna: External ear intact and fully developed External canal: Canal is patent with intact skin, after removal of dry skin debris and cerumen impaction on the left, right EAC was clear Tympanic Membrane: Clear and mobile External Nose: No scar or anatomic deformity Internal Nose: Septum is relatively straight on anterior rhinoscopy. No polyp, or purulence. Mucosal edema and erythema present.  Bilateral inferior turbinate hypertrophy.  Lips, Teeth, and gums: Mucosa and teeth intact and viable TMJ: No pain to palpation with full mobility Oral cavity/oropharynx: No erythema or exudate, no lesions present Neck Neck and Trachea: Midline trachea without mass or lesion Thyroid: No mass or nodularity Lymphatics: No lymphadenopathy  Procedure: Procedure: Cerumen Removal, left (CPT [phone])  Diagnosis: cerumen impaction, chronic eczematous otitis externa   Informed consent: Timeout  performed and informed consent was obtained.  Procedure: Operating microscope was employed to evaluate the ear(s).  Cerumen curette, speculum and suction were employed to clear the cerumen.   Findings: Normal appearing tympanic membrane on the left without perforations, and external canals are normal after removal of cerumen.No middle ear fluid bilaterally.   Complications: None. Patient tolerated well.    Studies Reviewed: CT chest 08/04/2022 CLINICAL DATA:  Ascending thoracic aortic aneurysm.   EXAM: CT CHEST WITHOUT CONTRAST   TECHNIQUE: Multidetector CT imaging of the chest was performed following the standard protocol without IV contrast.   RADIATION DOSE REDUCTION: This exam was performed according to the departmental dose-optimization program which includes automated exposure control, adjustment of the mA and/or kV according to patient size and/or use of iterative reconstruction technique.   COMPARISON:  02/04/2022   FINDINGS: Cardiovascular: Heart size upper normal. Coronary artery calcification is evident. Ascending thoracic aortic aneurysm is 4.8 cm diameter today compared to 4.9 cm previously. Mild atherosclerotic calcification is noted in the wall of the thoracic aorta.   Mediastinum/Nodes: No mediastinal lymphadenopathy. The tiny right pericardial cyst measured previously is similar today on image 94/2. Calcified lymph nodes in the AP window are compatible with old granulomatous disease. No evidence for gross hilar lymphadenopathy although assessment is limited by  the lack of intravenous contrast on the current study. The esophagus has normal imaging features. There is no axillary lymphadenopathy.   Lungs/Pleura: No new suspicious pulmonary nodule or mass. Calcified granulomata in the parahilar left lung are stable. No focal airspace consolidation. No pleural effusion.   Upper Abdomen: Visualized portion of the upper abdomen is unremarkable.    Musculoskeletal: No worrisome lytic or sclerotic osseous abnormality.   IMPRESSION: 1. Stable exam. No new or progressive findings. 2. Stable ascending thoracic aortic aneurysm measuring 4.8 cm diameter today compared to 4.9 cm previously. Ascending thoracic aortic aneurysm. Recommend semi-annual imaging followup by CTA or MRA and referral to cardiothoracic surgery if not already obtained. This recommendation follows 2010 ACCF/AHA/AATS/ACR/ASA/SCA/SCAI/SIR/STS/SVM Guidelines for the Diagnosis and Management of Patients With Thoracic Aortic Disease. Circulation. 2010; 121: Z610-R604. Aortic aneurysm NOS (ICD10-I71.9) 3. Old granulomatous disease in the left lung and mediastinum. 4.  Aortic Atherosclerosis (ICD10-I70.0).  10/25/22  CLINICAL DATA:  Chest pain.   EXAM: CHEST - 2 VIEW   COMPARISON:  January 16, 2013.   FINDINGS: The heart size and mediastinal contours are within normal limits. Both lungs are clear. The visualized skeletal structures are unremarkable.   IMPRESSION: No active cardiopulmonary disease.  Assessment/Plan: Encounter Diagnoses  Name Primary?   Impacted cerumen of left ear Yes   Chronic eczematous otitis externa of both ears    Bilateral hearing loss, unspecified hearing loss type    Sensorineural hearing loss (SNHL) of both ears    Nasal congestion    Environmental and seasonal allergies    Post-nasal drip    72 yoM with hx of AAA, Afib, on Eliquis, recently underwent cardioversion, hx of chronic nasal congestion, post-nasal drainage and likely environmental allergies, has been on Flonase and Allegra x 3 weeks, and it has been helping his cough and nasal congestion/post-nasal drainage, here for gradually worsening hearing. Had a recent audiogram, which we unfortunately do not have. Previously bought hearing aids from [employer], and they used to help him hear, but currently he is not able to hear normal conversations even with hearing aids in. Exam with left  sided cerumen/dry skin, removed today. I suspect worsening of baseline SNHL, will evaluate with repeat Audiogram.   - schedule Audiogram - continue Flonase and Allegra for allergy sx/nasal congestion  - return to discuss hearing test results   Thank you for allowing me to participate in the care of this patient. Please do not hesitate to contact me with any questions or concerns.   Ashok Croon, MD Otolaryngology [facility] Phone: [phone] Fax: [phone]    03/09/2023, 2:38 PM    03/09/2023, 2:38 PM

## 2023-03-09 NOTE — Patient Instructions (Addendum)
-   schedule Audiogram - continue Flonase and Allegra - return to discuss hearing test results

## 2023-03-30 ENCOUNTER — Ambulatory Visit: Payer: Medicare Other | Admitting: Cardiology

## 2023-03-30 ENCOUNTER — Encounter: Payer: Self-pay | Admitting: Cardiology

## 2023-03-30 VITALS — BP 113/80 | HR 69 | Resp 16 | Ht 70.0 in | Wt 176.0 lb

## 2023-03-30 DIAGNOSIS — I7121 Aneurysm of the ascending aorta, without rupture: Secondary | ICD-10-CM

## 2023-03-30 DIAGNOSIS — I480 Paroxysmal atrial fibrillation: Secondary | ICD-10-CM | POA: Diagnosis not present

## 2023-03-30 DIAGNOSIS — I700 Atherosclerosis of aorta: Secondary | ICD-10-CM

## 2023-03-30 DIAGNOSIS — I4891 Unspecified atrial fibrillation: Secondary | ICD-10-CM

## 2023-03-30 DIAGNOSIS — Z7901 Long term (current) use of anticoagulants: Secondary | ICD-10-CM | POA: Diagnosis not present

## 2023-03-30 DIAGNOSIS — I10 Essential (primary) hypertension: Secondary | ICD-10-CM | POA: Diagnosis not present

## 2023-03-30 NOTE — Progress Notes (Signed)
ID:  [name], DOB 15-Nov-1950, MRN [id number]  PCP:  Emilio Aspen, MD  Cardiologist:  Tessa Lerner, DO, [facility] (established care 01/27/23) Former Cardiology Providers: Dr. Clotilde Dieter  Date: 03/30/23 Last Office Visit: 11/23/2022  Chief Complaint  Patient presents with   post cardioversion    Follow-up   Atrial Fibrillation    HPI  [name] is a 72 y.o. 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  Patient is being followed in the practice for atrial fibrillation management.  Patient was diagnosed with A-fib in September 2023 to the best of his knowledge and since then has been on AV nodal blocking agents.  I started seeing the patient in June 2024 and we discussed undergoing direct-current cardioversion.  After he was on anticoagulation uninterrupted for 4 weeks he did undergo cardioversion which restored normal sinus rhythm and he is here for 2-week follow-up visit.  He remains in sinus rhythm based on today's EKG.  Clinically doing much better as well.  He is also reduce his alcohol intake to no more than 3 drinks per day.  He is willing to be evaluated for sleep apnea.  He denies anginal chest pain or heart failure symptoms.  Overall functional capacity remains stable.  CARDIAC DATABASE: EKG: January 27, 2023: Atrial fibrillation, 79 bpm. 03/30/2023: Sinus rhythm, 65 bpm, without underlying ischemia or injury pattern.   Echocardiogram: 01/17/2023: Left ventricle cavity is normal in size. Normal left ventricular wall thickness. Normal global wall motion. Normal LV systolic function with EF 68%. Unable to evaluate diastolic function due to atrial fibrillation. Left atrial cavity is moderately dilated at 48.3  ml/m^2. Trileaflet aortic valve. Trace aortic regurgitation. Mild mitral valve leaflet thickening. Mild (Grade I) mitral regurgitation. The aortic root is dilated, measuring 4.2 cm at sinus of Valsalva. Dilated ascending aorta, measuring 4.1 cm. IVC is dilated with respiratory variation. Estimated right atrial pressure 8 mmHg. No evidence of pulmonary hypertension.  01/06/2022 1. Dilated ascending aorta - 45 mm. 2. Left ventricular ejection fraction, by estimation, is 60 to 65%. The left ventricle has normal function. The left ventricle has no regional wall motion abnormalities. Left ventricular diastolic parameters are indeterminate. 3. Right ventricular systolic function is normal. The right ventricular size is normal. 4. The mitral valve is normal in structure. Trivial mitral valve regurgitation. No evidence of mitral stenosis. 5. Restricted right coronary cusp. The aortic valve is calcified. There is moderate calcification of the aortic valve. There is moderate thickening of the aortic valve. Aortic valve regurgitation is not visualized. Aortic valve sclerosis/calcification is present, without any evidence of aortic stenosis. 6. Aortic dilatation noted. There is moderate dilatation of the aortic root, measuring 45 mm. There is moderate dilatation of the ascending aorta, measuring 45 mm. 7. The inferior vena cava is normal in size with greater than 50% respiratory variability, suggesting right atrial pressure of 3 mmHg.  Stress Testing: Exercise nuclear stress test 06/20/2022 Myocardial perfusion is normal. Overall LV systolic function is normal without regional wall motion abnormalities. Stress LV EF: 60%. Low risk study. Normal ECG stress. The patient exercised for 1 minutes and 30 seconds of a Bruce protocol, achieving approximately 3.49 METs and 88% MPHR. The heart rate response was normal. The blood pressure response was normal. No previous exam available for comparison.  Zio  Patch:   Results reviewed final report forthcoming   RADIOLOGY: CT ANGIO CHEST AORTA W/CM & OR WO/CM 02/04/2022 1. Slight interval increase in size of the fusiform ascending aortic aneurysm which now measures 4.9 cm previously 4.6 cm. Ascending thoracic aortic aneurysm. Recommend semi-annual imaging followup by CTA or MRA and referral to cardiothoracic surgery if not already obtained. This recommendation follows 2010 ACCF/AHA/AATS/ACR/ASA/SCA/SCAI/SIR/STS/SVM Guidelines for the Diagnosis and Management of Patients With Thoracic Aortic Disease. Circulation. 2010; 121: V409-W119. Aortic aneurysm NOS (ICD10-I71.9) 2. 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 3.  Aortic Atherosclerosis (ICD10-I70.0).  CT chest w/o contrast 01.04.2024 1. Stable exam. No new or progressive findings. 2. Stable ascending thoracic aortic aneurysm measuring 4.8 cm diameter today compared to 4.9 cm previously. Ascending thoracic aortic aneurysm. Recommend semi-annual imaging followup by CTA or MRA and referral to cardiothoracic surgery if not already obtained. This recommendation follows 2010 ACCF/AHA/AATS/ACR/ASA/SCA/SCAI/SIR/STS/SVM Guidelines for the Diagnosis and Management of Patients With Thoracic Aortic Disease.Circulation. 2010; 121: J478-G956. Aortic aneurysm NOS (ICD10-I71.9) 3. Old granulomatous disease in the left lung and mediastinum. 4.  Aortic Atherosclerosis (ICD10-I70.0).  ALLERGIES: Allergies  Allergen Reactions   Carvedilol     Other Reaction(s): bradycardia and raynauds   Sulfa Antibiotics Itching   Cephalexin Rash   Levofloxacin Rash    MEDICATION LIST PRIOR TO VISIT: Current Meds  Medication Sig   acetaminophen (TYLENOL) 500 MG tablet Take 500-1,000 mg by mouth every 6 (six) hours as needed for moderate pain.   calcium carbonate (TUMS - DOSED IN MG ELEMENTAL CALCIUM) 500 MG chewable tablet Chew 1 tablet by mouth daily as needed for indigestion or heartburn.    diltiazem (CARDIZEM CD) 120 MG 24 hr capsule Take 120 mg by mouth daily.   ELIQUIS 5 MG TABS tablet TAKE 1 TABLET(5 MG) BY MOUTH TWICE DAILY   fexofenadine (ALLEGRA) 180 MG tablet Take 180 mg by mouth daily.   fluticasone (FLONASE) 50 MCG/ACT nasal spray Place 2 sprays into both nostrils daily.   rosuvastatin (CRESTOR) 5 MG tablet Take 5 mg by mouth every evening.   spironolactone (ALDACTONE) 25 MG tablet Take 12.5 mg by mouth daily.   valsartan-hydrochlorothiazide (DIOVAN-HCT) 320-12.5 MG tablet Take 1 tablet by mouth daily.     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   Concussion    x 2 yrs ago no residual   Gout    Hypertension    Pneumonia yrs ago   Right hydrocele     PAST SURGICAL HISTORY: Past Surgical History:  Procedure Laterality Date   CARDIOVERSION N/A 03/08/2023   Procedure: CARDIOVERSION;  Surgeon: Tessa Lerner, DO;  Location: [facility];  Service: Cardiovascular;  Laterality: N/A;   COLONOSCOPY     Hx: of   ESOPHAGOGASTRODUODENOSCOPY N/A 10/24/2012   Procedure: ESOPHAGOGASTRODUODENOSCOPY (EGD);  Surgeon: Willis Modena, MD;  Location: [facility];  Service: Endoscopy;  Laterality: N/A;   EUS N/A 10/24/2012   Procedure: ESOPHAGEAL ENDOSCOPIC ULTRASOUND (EUS) RADIAL;  Surgeon: Willis Modena, MD;  Location: [facility];  Service: Endoscopy;  Laterality: N/A;  + or - FNA   FINE NEEDLE ASPIRATION N/A 10/24/2012   Procedure: FINE NEEDLE ASPIRATION (FNA) LINEAR;  Surgeon: Willis Modena, MD;  Location: [facility];  Service: Endoscopy;  Laterality: N/A;   HYDROCELE EXCISION Right 04/30/2020   Procedure: HYDROCELECTOMY ADULT;  Surgeon: Marcine Matar, MD;  Location: [facility];  Service: Urology;  Laterality: Right;   INGUINAL HERNIA REPAIR  1985   right   LAPAROSCOPIC LIVER ULTRASOUND N/A 01/10/2013   Procedure: LAPAROSCOPIC LIVER ULTRASOUND;   Surgeon: [name], MD;  Location[facility];  Service: General;  Laterality: N/A;   LAPAROSCOPY N/A 01/10/2013   Procedure: LAPAROSCOPY DIAGNOSTIC;  Surgeon: [name], MD;  Location: [facility] OR;  Service: General;  Laterality: N/A;   RETROPERITONEAL MASS EXCISION  01/10/2013   TONSILLECTOMY  as child    FAMILY HISTORY: The patient family history includes Heart disease in his father.  SOCIAL HISTORY:  The patient  reports that he has never smoked. He has never used smokeless tobacco. He reports current alcohol use. He reports that he does not use drugs.  REVIEW OF SYSTEMS: Review of Systems  Cardiovascular:  Negative for chest pain, claudication, dyspnea on exertion, irregular heartbeat, leg swelling, near-syncope, orthopnea, palpitations, paroxysmal nocturnal dyspnea and syncope.  Respiratory:  Positive for snoring. Negative for shortness of breath.   Hematologic/Lymphatic: Negative for bleeding problem.  Musculoskeletal:  Negative for muscle cramps and myalgias.  Neurological:  Negative for dizziness and light-headedness.    PHYSICAL EXAM:    03/30/2023    2:30 PM 03/09/2023    1:58 PM 03/08/2023    9:03 AM  Vitals with BMI  Height 5\' 10"     Weight 176 lbs    BMI 25.25    Systolic 113 128 409  Diastolic 80 77 90  Pulse 69 73     Physical Exam  Constitutional: No distress.  Age appropriate, hemodynamically stable.   Neck: No JVD present.  Cardiovascular: Normal rate, regular rhythm, S1 normal and S2 normal. Exam reveals no gallop, no S3 and no S4.  No murmur heard. Pulmonary/Chest: Effort normal and breath sounds normal. No stridor. He has no wheezes. He has no rales.  Abdominal: Soft. Bowel sounds are normal. He exhibits no distension. There is no abdominal tenderness.  Musculoskeletal:        General: No edema.     Cervical back: Neck supple.  Neurological: He is alert and oriented to person, place, and time. He has intact cranial nerves (2-12).  Skin: Skin is warm and  moist.     LABORATORY DATA:    Latest Ref Rng & Units 03/08/2023    8:16 AM 10/25/2022    9:15 PM 04/30/2020   11:30 AM  CBC  WBC 4.0 - 10.5 K/uL  7.2    Hemoglobin 13.0 - 17.0 g/dL 81.1  91.4  78.2   Hematocrit 39.0 - 52.0 % 43.0  42.6  45.0   Platelets 150 - 400 K/uL  241         Latest Ref Rng & Units 03/08/2023    8:16 AM 10/25/2022    9:15 PM 06/08/2021    4:46 PM  CMP  Glucose 70 - 99 mg/dL 956  213  95   BUN 8 - 23 mg/dL 28  28  21    Creatinine 0.61 - 1.24 mg/dL 0.86  5.78  4.69   Sodium 135 - 145 mmol/L 129  131  133   Potassium 3.5 - 5.1 mmol/L 4.8  3.8  3.6   Chloride 98 - 111 mmol/L 93  93  99   CO2 22 - 32 mmol/L  24  26   Calcium 8.9 - 10.3 mg/dL  9.7  9.4     No results found for: "CHOL", "HDL", "LDLCALC", "LDLDIRECT", "TRIG", "CHOLHDL" No  components found for: "NTPROBNP" No results for input(s): "PROBNP" in the last 8760 hours. No results for input(s): "TSH" in the last 8760 hours.  BMP Recent Labs    10/25/22 2115 03/08/23 0816  NA 131* 129*  K 3.8 4.8  CL 93* 93*  CO2 24  --   GLUCOSE 123* 106*  BUN 28* 28*  CREATININE 1.64* 1.30*  CALCIUM 9.7  --   GFRNONAA 44*  --     HEMOGLOBIN A1C No results found for: "HGBA1C", "MPG"  IMPRESSION:    ICD-10-CM   1. Paroxysmal atrial fibrillation (HCC)  I48.0 EKG 12-Lead    Ambulatory referral to Sleep Studies    2. Long term (current) use of anticoagulants  Z79.01 Ambulatory referral to Sleep Studies    3. Essential hypertension  I10     4. Aneurysm of the ascending aorta, without rupture (HCC)  I71.21     5. Atherosclerosis of aorta (HCC)  I70.0        RECOMMENDATIONS: CALEV NAVA is a 73 y.o. 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  Paroxysmal  atrial fibrillation (HCC) Discovered in September 2023. Rate control: Diltiazem. Rhythm control: N/A. Thromboembolic prophylaxis: Eliquis. Status post cardioversion 03/08/2023 EKG today shows sinus Reemphasize importance of reducing alcohol to no more than 2 beverages per day. Patient is willing to be evaluated by sleep medicine for sleep apnea. Click Here to Calculate/Change CHADS2VASc Score The patient's CHADS2-VASc score is 3, indicating a 3.2% annual risk of stroke.   CHF History: No HTN History: Yes Diabetes History: No Stroke History: No Vascular Disease History: Yes  Essential hypertension Aneurysm of the ascending aorta without rupture Office blood pressures are very well-controlled. Continue current medical therapy. Recommend a goal SBP of 120 mmHg if able to tolerate given his ascending aortic aneurysm. Patient follows up with cardiothoracic surgery.  Have asked him to follow-up with them as recommended or annually.  He will need to have CT of the chest prior to their office visit.  He will reach out to Dr. Lucilla Lame office for further recommendations.  Atherosclerosis of aorta [facility]) Currently on appropriate medical therapy. Ischemic workup as outlined above.  Snoring Clinically there is suspicion for sleep apnea given symptoms of snoring, difficulty sleeping at night, and atrial fibrillation.  Patient is agreeable to be evaluated by sleep medicine.  Referral placed.  FINAL MEDICATION LIST END OF ENCOUNTER: No orders of the defined types were placed in this encounter.   Medications Discontinued During This Encounter  Medication Reason   metoprolol succinate (TOPROL-XL) 100 MG 24 hr tablet      Current Outpatient Medications:    acetaminophen (TYLENOL) 500 MG tablet, Take 500-1,000 mg by mouth every 6 (six) hours as needed for moderate pain., Disp: , Rfl:    calcium carbonate (TUMS - DOSED IN MG ELEMENTAL CALCIUM) 500 MG chewable tablet, Chew 1 tablet by mouth daily  as needed for indigestion or heartburn., Disp: , Rfl:    diltiazem (CARDIZEM CD) 120 MG 24 hr capsule, Take 120 mg by mouth daily., Disp: , Rfl:    ELIQUIS 5 MG TABS tablet, TAKE 1 TABLET(5 MG) BY MOUTH TWICE DAILY, Disp: 60 tablet, Rfl: 6   fexofenadine (ALLEGRA) 180 MG tablet, Take 180 mg by mouth daily., Disp: , Rfl:    fluticasone (FLONASE) 50  MCG/ACT nasal spray, Place 2 sprays into both nostrils daily., Disp: , Rfl:    rosuvastatin (CRESTOR) 5 MG tablet, Take 5 mg by mouth every evening., Disp: , Rfl:    spironolactone (ALDACTONE) 25 MG tablet, Take 12.5 mg by mouth daily., Disp: , Rfl:    valsartan-hydrochlorothiazide (DIOVAN-HCT) 320-12.5 MG tablet, Take 1 tablet by mouth daily., Disp: , Rfl:   Orders Placed This Encounter  Procedures   Ambulatory referral to Sleep Studies   EKG 12-Lead    There are no Patient Instructions on file for this visit.   --Continue cardiac medications as reconciled in final medication list. --Return in about 6 months (around 09/29/2023) for Follow up, A. fib. or sooner if needed. --Continue follow-up with your primary care physician regarding the management of your other chronic comorbid conditions.  Patient's questions and concerns were addressed to his satisfaction. He voices understanding of the instructions provided during this encounter.   This note was created using a voice recognition software as a result there may be grammatical errors inadvertently enclosed that do not reflect the nature of this encounter. Every attempt is made to correct such errors.  Delilah Shan [facility]  Pager:  [phone] Office: [phone] to

## 2023-04-05 ENCOUNTER — Ambulatory Visit: Payer: Medicare Other | Attending: Otolaryngology | Admitting: Audiologist

## 2023-04-05 DIAGNOSIS — H903 Sensorineural hearing loss, bilateral: Secondary | ICD-10-CM | POA: Insufficient documentation

## 2023-04-05 NOTE — Procedures (Signed)
  Outpatient Audiology and [facility] [address] [place], [place]  [address] [phone]  AUDIOLOGICAL  EVALUATION  NAME: [name]     DOB:   26-Feb-1951      MRN: [id number]                                                                                     DATE: 04/05/2023     REFERENT: Emilio Aspen, MD STATUS: Outpatient DIAGNOSIS: Sensorineural Hearing Loss Bilateral     History: [name] was seen for an audiological evaluation.  [name] was referred by Otolaryngology. [name] recently was seen due to cerumen build up, a feeling of aural fulness, and difficulty hearing. Neither ear hurts, and [name] denies tinnitus.[name] has worse hearing in the left ear, has been that way for decades. [name] has never used firearms recreationally. No noise exposure history. [name] is hoping something can be done to correct [name] hearing due to difficulty on a daily basis hearing people clearly.   [name] hearing aids were fit at [facility]. [name] feels speech is not clear even with [name] aids on. They did not perform Real Ear Verification and [name] is using medium power received with power domes. Both is recent hearing tests showed no masking of bone, right ear loss is sensorineural, left ear hearing loss unspecified.   Evaluation:  Otoscopy showed a clear view of the tympanic membranes clear of cerumen, bilaterally Tympanometry results were consistent with normal middle ear function Audiometric testing was completed using conventional audiometry with insert and supraural transducer. Speech Recognition Thresholds were 55dB in the right ear and 70dB in the left ear. Word Recognition was performed 90dB HL, scored 84% in the right ear and 64% in the left ear. Pure tone thresholds show sensorineural  sloping hearing loss bilaterally, asymmetry present 1-2kHz. This is consistent with previous thresholds.   Results:  The test results were reviewed with [name]. Results today show the left ear asymmetry is  sensorineural. Thresholds consistent with previous testing. Word recognition is fair, even with hearing aids speech understanding will not be perfect. Still recommend having second opinion on hearing aid fit.     Recommendations: Follow up with ENT Physician as scheduled. If hearing aids not working well for daily conversation, follow up with [employer] [facility] for a "Real Ear Verification". This will test the aids from [employer] to make sure they are meeting prescription based on level of loss from today's testing.    48 minutes spent testing and counseling on results.   Ammie Ferrier  Audiologist, Au.D., CCC-A 04/05/2023  11:51 AM  Cc: Emilio Aspen, MD

## 2023-04-12 ENCOUNTER — Ambulatory Visit (INDEPENDENT_AMBULATORY_CARE_PROVIDER_SITE_OTHER): Payer: Medicare Other | Admitting: Otolaryngology

## 2023-04-12 ENCOUNTER — Encounter (INDEPENDENT_AMBULATORY_CARE_PROVIDER_SITE_OTHER): Payer: Self-pay | Admitting: Otolaryngology

## 2023-04-12 VITALS — BP 111/70 | HR 84

## 2023-04-12 DIAGNOSIS — H9193 Unspecified hearing loss, bilateral: Secondary | ICD-10-CM

## 2023-04-12 DIAGNOSIS — J3089 Other allergic rhinitis: Secondary | ICD-10-CM

## 2023-04-12 DIAGNOSIS — R0981 Nasal congestion: Secondary | ICD-10-CM

## 2023-04-12 DIAGNOSIS — H903 Sensorineural hearing loss, bilateral: Secondary | ICD-10-CM | POA: Diagnosis not present

## 2023-04-12 DIAGNOSIS — R0982 Postnasal drip: Secondary | ICD-10-CM

## 2023-04-12 NOTE — Progress Notes (Signed)
ENT Progress Note:  Update 04/12/23: He returns following his hearing evaluation.  Overall symptoms are stable and somewhat improved, still using Flonase and Allegra.  He also brought to prior audiograms with him 1 in 2015 and 1 in 2022 per my review.   Summary of reviewed Audiogram results:  Left ear asymmetry is sensorineural. Thresholds consistent with previous testing from [employer]. Referred him to [facility] to get the levels of his hearing aids evaluated, I suspect he is underfit. See report for details  Initial Evaluation   Reason for Consult: post-nasal drainage, nasal congestion, allergy sx and decreased hearing  HPI: [name] is an 72 y.o. male with hx of AAA (4.8) He was started on Allegra and Flonase x 3 weeks ago. He feels that it is helping with sneezing and coughing he has had before. He continues to clear his throat. Left ear has always been worse, than the right. He wears hearing aids from [employer]. Has a hard time with regular conversations. He denies otalgia. He thinks his ears are clogged up with wax. He has mild soreness in front of his left ear a couple of months ago, but now resolved. No ear or sinus surgeries. He had Audiogram 3-4 months ago, with outside Audiologist, who reset the settings on his [employer] hearing aids. It helped for a bit, but now he cannot hear. He denies tinnitus. He had cardioversion yesterday and was told he might be able to stop Eliquis he takes for Afib. He thinks he lost his hearing when he was   Records Reviewed:  ED visit 10/25/22  Records Reviewed:  ED visit note 10/25/22 - seen for syncopal even which was deemed to be vasovagal. Had negative CXR at the time  72 yo M with a chief complaints of a syncopal event.  This happened while he was watching a play.  He became very sweaty and lost consciousness maybe for about 5 minutes.  He denies any specific symptoms now denies headache chest pain abdominal pain back pain.  Has been eating and drinking normally.   He felt a bit thirsty right before it happened.   He has a history of a ascending thoracic aortic aneurysm.  He also has A-fib.  He has been taking metoprolol off and on.    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   Concussion    x 2 yrs ago no residual   Gout    Hypertension    Pneumonia yrs ago   Right hydrocele     Past Surgical History:  Procedure Laterality Date   CARDIOVERSION N/A 03/08/2023   Procedure: CARDIOVERSION;  Surgeon: Tessa Lerner, DO;  Location: [facility];  Service: Cardiovascular;  Laterality: N/A;   COLONOSCOPY     Hx: of   ESOPHAGOGASTRODUODENOSCOPY N/A 10/24/2012   Procedure: ESOPHAGOGASTRODUODENOSCOPY (EGD);  Surgeon: Willis Modena, MD;  Location: [facility];  Service: Endoscopy;  Laterality: N/A;   EUS N/A 10/24/2012   Procedure: ESOPHAGEAL ENDOSCOPIC ULTRASOUND (EUS) RADIAL;  Surgeon: Willis Modena, MD;  Location: [facility];  Service: Endoscopy;  Laterality: N/A;  + or - FNA   FINE NEEDLE ASPIRATION N/A 10/24/2012   Procedure: FINE NEEDLE ASPIRATION (FNA) LINEAR;  Surgeon: Willis Modena, MD;  Location: [facility];  Service: Endoscopy;  Laterality: N/A;   HYDROCELE EXCISION Right 04/30/2020   Procedure: HYDROCELECTOMY ADULT;  Surgeon: Marcine Matar, MD;  Location: [facility];  Service: Urology;  Laterality: Right;   INGUINAL HERNIA REPAIR  1985   right   LAPAROSCOPIC LIVER ULTRASOUND N/A 01/10/2013   Procedure: LAPAROSCOPIC LIVER ULTRASOUND;  Surgeon: [name], MD;  Location: [facility] OR;  Service: General;  Laterality: N/A;   LAPAROSCOPY N/A 01/10/2013   Procedure: LAPAROSCOPY DIAGNOSTIC;  Surgeon: [name], MD;  Location: [facility] OR;  Service: General;  Laterality: N/A;   RETROPERITONEAL MASS EXCISION  01/10/2013   TONSILLECTOMY  as child    Family History  Problem Relation Age of Onset   Heart disease Father     Social History:   reports that he has never smoked. He has never used smokeless tobacco. He reports current alcohol use. He reports that he does not use drugs.  Allergies:  Allergies  Allergen Reactions   Carvedilol     Other Reaction(s): bradycardia and raynauds   Sulfa Antibiotics Itching   Cephalexin Rash   Levofloxacin Rash    Medications: I have reviewed the patient's current medications.  The PMH, PSH, Medications, Allergies, and SH were reviewed and updated.  ROS: Constitutional: Negative for fever, weight loss and weight gain. Cardiovascular: Negative for chest pain and dyspnea on exertion. Respiratory: Is not experiencing shortness of breath at rest. Gastrointestinal: Negative for nausea and vomiting. Neurological: Negative for headaches. Psychiatric: The patient is not nervous/anxious  Blood pressure 111/70, pulse 84, SpO2 97%.  PHYSICAL EXAM:  Exam: General: Well-developed, well-nourished Respiratory Respiratory effort: Equal inspiration and expiration without stridor Cardiovascular Neuro/Psych/Balance: Patient oriented to person, place, and time; Appropriate mood and affect; Gait is intact with no imbalance; Cranial nerves I-XII are intact Head and Face Inspection: Normocephalic and atraumatic without mass or lesion Facial Strength: Facial motility symmetric and full bilaterally ENT Pinna: External ear intact and fully developed External Nose: No scar or anatomic deformity   Studies Reviewed: CT chest 08/04/2022 CLINICAL DATA:  Ascending thoracic aortic aneurysm.   E Audiogram results 04/05/23 [name] was seen for an audiologic al evaluation.  [name] was referred by Otolaryngology. He recently was seen due to cerumen build up, a feeling of aural fulness, and difficulty hearing. Neither ear hurts, and he denies tinnitus.He has worse hearing in the left ear, has been that way for decades. He has never used firearms recreationally. No noise exposure history. He is hoping something  can be done to correct his hearing due to difficulty on a daily basis hearing people clearly.    His hearing aids were fit at [facility]. He feels speech is not clear even with he aids on. They did not perform Real Ear Verification and he is using medium power received with power domes. Both is recent hearing tests showed no masking of bone, right ear loss is sensorineural, left ear hearing loss unspecified.    Evaluation:  Otoscopy showed a clear view of the tympanic membranes clear of cerumen, bilaterally Tympanometry results were consistent with normal middle ear function Audiometric testing was completed using conventional audiometry with insert and supraural transducer. Speech Recognition Thresholds were 55dB in the right ear and 70dB in the left ear. Word Recognition was performed 90dB HL, scored 84% in the right ear and 64% in the left ear. Pure tone thresholds show sensorineural  sloping hearing loss bilaterally, asymmetry present 1-2kHz. This is consistent with previous thresholds.    Results:  The test results were reviewed with [name]. Results today show the left ear asymmetry is sensorineural. Thresholds consistent with previous testing. Word recognition is fair,  even with hearing aids speech understanding will not be perfect. Still recommend having second opinion on hearing aid fit.       Recommendations: Follow up with ENT Physician as scheduled. If hearing aids not working well for daily conversation, follow up with [facility] for a "Real Ear Verification". This will test the aids from [employer] to make sure they are meeting prescription based on level of loss from today's testing.   Assessment/Plan: Encounter Diagnoses  Name Primary?   Bilateral hearing loss, unspecified hearing loss type Yes   Sensorineural hearing loss (SNHL) of both ears    Nasal congestion    Environmental and seasonal allergies    Post-nasal drip     72 yoM with hx of AAA, Afib, on Eliquis,  recently underwent cardioversion, hx of chronic nasal congestion, post-nasal drainage and likely environmental allergies, has been on Flonase and Allegra x 3 weeks, and it has been helping his cough and nasal congestion/post-nasal drainage, here for gradually worsening hearing. Had a recent audiogram, which we unfortunately do not have. Previously bought hearing aids from [employer], and they used to help him hear, but currently he is not able to hear normal conversations even with hearing aids in. Exam with left sided cerumen/dry skin, removed today. I suspect worsening of baseline SNHL, will evaluate with repeat Audiogram.   - schedule Audiogram - continue Flonase and Allegra for allergy sx/nasal congestion  - return to discuss hearing test results   Update 04/12/23 hearing evaluation showed moderate sloping to severe sensorineural hearing loss with slight asymmetry and mid frequency range and more pronounced hearing loss on the left side.  I reviewed the copies of his prior audiograms done in 2015 and 2020 that he brought with him, and do not see any significant change in the degree of hearing loss relative to the old hearing evaluations.  This was also mentioned in the report by audiology.  He is already scheduled to see hearing aid specialist where he purchased his current hearing aids to see if he was under fit, and make adjustments.  Also already had a referral to [facility] to have levels of his hearing aids evaluated.  I do not believe that the degree of asymmetry between the right and left ear warrants MRI IAC, because the same asymmetry is seen on his prior audiogram done 9 years ago, and it is limited to only few frequencies.  He denies dizziness or vertigo.  He had normal type a tymps bilaterally  SNHL b/l - see [facility] for evaluation of hearing aids  - annual audiogram  Nasal congestion and allergy sx - continue daily Flonase 2 puffs BID and Allegra 180 mg daily   RTC PRN  Nathan Croon,  MD Otolaryngology [facility] Phone: [phone] Fax: [phone]    04/12/2023, 9:06 PM    04/12/2023, 9:06 PM

## 2023-06-14 ENCOUNTER — Other Ambulatory Visit: Payer: Self-pay | Admitting: Internal Medicine

## 2023-06-20 ENCOUNTER — Other Ambulatory Visit: Payer: Self-pay

## 2023-06-20 MED ORDER — ROSUVASTATIN CALCIUM 5 MG PO TABS: 5.0000 mg | ORAL_TABLET | Freq: Every evening | ORAL | 2 refills | Status: DC

## 2023-07-10 ENCOUNTER — Other Ambulatory Visit: Payer: Self-pay | Admitting: Thoracic Surgery (Cardiothoracic Vascular Surgery)

## 2023-07-10 DIAGNOSIS — I7121 Aneurysm of the ascending aorta, without rupture: Secondary | ICD-10-CM

## 2023-08-08 DIAGNOSIS — H35373 Puckering of macula, bilateral: Secondary | ICD-10-CM | POA: Diagnosis not present

## 2023-08-08 DIAGNOSIS — H524 Presbyopia: Secondary | ICD-10-CM | POA: Diagnosis not present

## 2023-08-08 DIAGNOSIS — H2513 Age-related nuclear cataract, bilateral: Secondary | ICD-10-CM | POA: Diagnosis not present

## 2023-08-18 ENCOUNTER — Other Ambulatory Visit: Payer: Medicare Other

## 2023-08-21 DIAGNOSIS — L821 Other seborrheic keratosis: Secondary | ICD-10-CM | POA: Diagnosis not present

## 2023-08-21 DIAGNOSIS — L812 Freckles: Secondary | ICD-10-CM | POA: Diagnosis not present

## 2023-08-21 DIAGNOSIS — D2262 Melanocytic nevi of left upper limb, including shoulder: Secondary | ICD-10-CM | POA: Diagnosis not present

## 2023-08-21 DIAGNOSIS — D2261 Melanocytic nevi of right upper limb, including shoulder: Secondary | ICD-10-CM | POA: Diagnosis not present

## 2023-08-21 DIAGNOSIS — D225 Melanocytic nevi of trunk: Secondary | ICD-10-CM | POA: Diagnosis not present

## 2023-08-21 DIAGNOSIS — L570 Actinic keratosis: Secondary | ICD-10-CM | POA: Diagnosis not present

## 2023-08-21 DIAGNOSIS — Z85828 Personal history of other malignant neoplasm of skin: Secondary | ICD-10-CM | POA: Diagnosis not present

## 2023-08-24 DIAGNOSIS — Z03818 Encounter for observation for suspected exposure to other biological agents ruled out: Secondary | ICD-10-CM | POA: Diagnosis not present

## 2023-08-24 DIAGNOSIS — J069 Acute upper respiratory infection, unspecified: Secondary | ICD-10-CM | POA: Diagnosis not present

## 2023-08-25 ENCOUNTER — Ambulatory Visit
Admission: RE | Admit: 2023-08-25 | Discharge: 2023-08-25 | Disposition: A | Discharge status: Home / Self Care | Payer: Medicare Other | Source: Ambulatory Visit | Attending: Thoracic Surgery (Cardiothoracic Vascular Surgery)

## 2023-08-25 ENCOUNTER — Ambulatory Visit (INDEPENDENT_AMBULATORY_CARE_PROVIDER_SITE_OTHER): Payer: Medicare Other | Admitting: Thoracic Surgery (Cardiothoracic Vascular Surgery)

## 2023-08-25 DIAGNOSIS — I7121 Aneurysm of the ascending aorta, without rupture: Secondary | ICD-10-CM

## 2023-08-25 MED ORDER — IOPAMIDOL (ISOVUE-370) INJECTION 76%
500.0000 mL | Freq: Once | INTRAVENOUS | Status: AC | PRN
  Administered 2023-08-25: 75 mL via INTRAVENOUS

## 2023-08-25 NOTE — Progress Notes (Signed)
     [address]       [name] [address]             [phone]        Patient: [name] Wright: Office Consent for Telemedicine visit obtained.   Today's visit was completed via a real-time telehealth (see specific modality noted below). [name] patient/authorized person provided oral consent at [name] time of [name] visit to engage in a telemedicine encounter with [name] present Wright at [facility]. [name] patient/authorized person was informed of [name] potential benefits, limitations, and risks of telemedicine. [name] patient/authorized person expressed understanding that [name] laws that protect confidentiality also apply to telemedicine. [name] patient/authorized person acknowledged understanding that telemedicine does not provide emergency services and that he or she would need to call 911 or proceed to [name] nearest hospital for help if such a need arose.   Total time spent in [name] clinical discussion 10 minutes.  Telehealth Modality: Phone visit (audio only)   Recent Radiology Findings:   IMPRESSION: Stable 4.8 cm ascending thoracic aortic aneurysm. Recommend semi-annual imaging followup by CTA or MRA and referral to cardiothoracic surgery if not already obtained. This recommendation follows 2010 ACCF/AHA/AATS/ACR/ASA/SCA/SCAI/SIR/STS/SVM Guidelines for [name] Diagnosis and Management of Patients With Thoracic Aortic Disease. Circulation. 2010; 121: E952-W413. Aortic aneurysm NOS (ICD10-I71.9)   I had a telephone visit with [name] Wright   Assessment: 73yo male with a 4.8 cm ascending aortic aneurysm.  Echocardiogram shows a tricuspid valve without evidence of regurgitation.  We discussed [name] natural history and and risk factors for growth of ascending aortic aneurysms.  We covered [name] importance of smoking cessation, tight blood pressure control, refraining from lifting heavy objects, and avoiding fluoroquinolones.  [name] patient is aware of signs and symptoms of aortic dissection and when to  present to [name] emergency department.  We will continue surveillance and a repeat CT was ordered for 12 months.   He also has an upper respiratory infection, which likely explains [name] mucous plugging seen on [name] scan.  He has follow-up with his primary care physician for evaluation. [name] Wright

## 2023-08-28 ENCOUNTER — Other Ambulatory Visit: Payer: Self-pay | Admitting: Cardiology

## 2023-08-28 NOTE — Telephone Encounter (Signed)
Prescription refill request for Eliquis received. Indication:afib Last office visit:8/24 Scr:1.30  8/24 Age: 73 Weight:79.8  kg  Prescription refilled

## 2023-08-29 ENCOUNTER — Other Ambulatory Visit: Payer: Self-pay | Admitting: Internal Medicine

## 2023-09-05 ENCOUNTER — Other Ambulatory Visit: Payer: Self-pay

## 2023-09-05 MED ORDER — SPIRONOLACTONE 25 MG PO TABS: 12.5000 mg | ORAL_TABLET | Freq: Every day | ORAL | 2 refills | Status: AC

## 2023-09-25 DIAGNOSIS — J029 Acute pharyngitis, unspecified: Secondary | ICD-10-CM | POA: Diagnosis not present

## 2023-09-25 DIAGNOSIS — R051 Acute cough: Secondary | ICD-10-CM | POA: Diagnosis not present

## 2023-09-25 DIAGNOSIS — R509 Fever, unspecified: Secondary | ICD-10-CM | POA: Diagnosis not present

## 2023-09-28 ENCOUNTER — Ambulatory Visit: Payer: Self-pay | Admitting: Cardiology

## 2023-09-28 ENCOUNTER — Ambulatory Visit: Payer: Medicare Other | Admitting: Cardiology

## 2023-10-18 DIAGNOSIS — H25812 Combined forms of age-related cataract, left eye: Secondary | ICD-10-CM | POA: Diagnosis not present

## 2023-10-18 DIAGNOSIS — H2512 Age-related nuclear cataract, left eye: Secondary | ICD-10-CM | POA: Diagnosis not present

## 2023-10-24 ENCOUNTER — Ambulatory Visit: Payer: Medicare Other | Attending: Cardiology | Admitting: Cardiology

## 2023-10-24 ENCOUNTER — Encounter: Payer: Self-pay | Admitting: Cardiology

## 2023-10-24 ENCOUNTER — Telehealth: Payer: Self-pay | Admitting: *Deleted

## 2023-10-24 VITALS — BP 100/82 | HR 82 | Resp 16 | Ht 70.0 in | Wt 174.0 lb

## 2023-10-24 DIAGNOSIS — R0683 Snoring: Secondary | ICD-10-CM | POA: Diagnosis not present

## 2023-10-24 DIAGNOSIS — I7121 Aneurysm of the ascending aorta, without rupture: Secondary | ICD-10-CM | POA: Diagnosis not present

## 2023-10-24 DIAGNOSIS — I4819 Other persistent atrial fibrillation: Secondary | ICD-10-CM

## 2023-10-24 DIAGNOSIS — I700 Atherosclerosis of aorta: Secondary | ICD-10-CM

## 2023-10-24 DIAGNOSIS — Z7901 Long term (current) use of anticoagulants: Secondary | ICD-10-CM

## 2023-10-24 NOTE — Telephone Encounter (Signed)
 DR. Odis Hollingshead ORDERED ITAMAR STUDY.   Patient agreement reviewed and signed on 10/24/2023.  WatchPAT issued to patient on 10/24/2023 by Danielle Rankin, CMA. Patient aware to not open the WatchPAT box until contacted with the activation PIN. Patient profile initialized in CloudPAT on 10/24/2023 by Danielle Rankin, CMA. Device serial number: [id number]  Please list Reason for Call as Advice Only and type "WatchPAT issued to patient" in the comment box.

## 2023-10-24 NOTE — Progress Notes (Signed)
 Cardiology Office Note:  .   Date:  10/24/2023  ID:  [name], DOB 11/20/1950, MRN [id number] PCP:  Emilio Aspen, MD  Former Cardiology Providers: Dr. Clotilde Dieter [facility] Providers Cardiologist:  Tessa Lerner, DO , [facility] (established care 01/27/23) Electrophysiologist:  None  Click to update primary MD,subspecialty MD or APP then REFRESH:1}    Chief Complaint  Patient presents with   Follow-up    6 months Afib follow up     History of Present Illness: .   [name] is a 73 y.o. 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   Patient being followed by the practice given his underlying atrial fibrillation.  Diagnosed with A-fib back in September 2023 to the best of his knowledge and since then he has been on AV nodal blocking agents.  I started seeing the patient in June 2024 and we discussed undergoing direct-current cardioversion.  After being on anticoagulation uninterrupted for 4 weeks he did undergo cardioversion which restored normal sinus rhythm.  At the last office visit I referred him to sleep medicine for evaluation of sleep apnea and also asked him to reduce alcohol consumption to no more than 2 beverages per day.  Patient has a history of ascending aortic aneurysm and is currently being followed by cardiothoracic surgery-Dr. Lightfoot's office.  He presents today for 55-month follow-up visit.  Doing well from a cardiovascular standpoint.  Denies anginal chest pain or heart failure symptoms.  Has had multiple respiratory tract infections.  Also follows up with Dr. Cliffton Asters given his ascending aortic aneurysm most recent CT was performed in January 2025 results reviewed and noted below.  At the  last office visit he was recommended to have a sleep study which still pending.  Patient is willing to have that arranged with our practice.  He is down to 2 alcoholic beverages per day.  Review of Systems: .   Review of Systems  Cardiovascular:  Negative for chest pain, claudication, irregular heartbeat, leg swelling, near-syncope, orthopnea, palpitations, paroxysmal nocturnal dyspnea and syncope.  Respiratory:  Negative for shortness of breath.   Hematologic/Lymphatic: Negative for bleeding problem.    Studies Reviewed:   EKG: EKG Interpretation Date/Time:  Tuesday October 24 2023 09:24:08 EDT Ventricular Rate:  73 PR Interval:  202 QRS Duration:  92 QT Interval:  388 QTC Calculation: 427 R Axis:   35  Text Interpretation: Normal sinus rhythm Normal ECG When compared with ECG of 08-Mar-2023 08:41, Premature atrial complexes are no longer Present Confirmed by Tessa Lerner [phone]) on 10/24/2023 9:35:42 AM  Echocardiogram: 01/17/2023: Left ventricle cavity is normal in size. Normal left ventricular wall thickness. Normal global wall motion. Normal LV systolic function with EF 68%. Unable to evaluate diastolic function due to atrial fibrillation. Left atrial cavity is moderately dilated at 48.3 ml/m^2. Trileaflet aortic valve. Trace aortic regurgitation. Mild mitral valve leaflet thickening. Mild (Grade I) mitral regurgitation. The aortic root is dilated, measuring 4.2 cm at sinus of Valsalva. Dilated ascending aorta, measuring 4.1 cm. IVC is dilated with respiratory variation. Estimated right atrial pressure 8 mmHg. No evidence of pulmonary hypertension.  Stress Testing: Exercise nuclear stress test 06/20/2022: Low risk study  RADIOLOGY: CT ANGIO CHEST AORTA W/CM & OR WO/CM 08/2023 Stable 4.8 cm ascending thoracic aortic aneurysm. Recommend semi-annual imaging followup  by CTA or MRA and referral to cardiothoracic surgery if not already obtained. This recommendation follows 2010  ACCF/AHA/AATS/ACR/ASA/SCA/SCAI/SIR/STS/SVM Guidelines for the Diagnosis and Management of Patients With Thoracic Aortic Disease. Circulation. 2010; 121: U045-W098. Aortic aneurysm NOS (ICD10-I71.9). See report for additional details  Risk Assessment/Calculations:   Click Here to Calculate/Change CHADS2VASc Score The patient's CHADS2-VASc score is 3, indicating a 3.2% annual risk of stroke.   CHF History: No HTN History: Yes Diabetes History: No Stroke History: No Vascular Disease History: Yes   Labs:       Latest Ref Rng & Units 03/08/2023    8:16 AM 10/25/2022    9:15 PM 04/30/2020   11:30 AM  CBC  WBC 4.0 - 10.5 K/uL  7.2    Hemoglobin 13.0 - 17.0 g/dL 11.9  14.7  82.9   Hematocrit 39.0 - 52.0 % 43.0  42.6  45.0   Platelets 150 - 400 K/uL  241         Latest Ref Rng & Units 03/08/2023    8:16 AM 10/25/2022    9:15 PM 06/08/2021    4:46 PM  BMP  Glucose 70 - 99 mg/dL 562  130  95   BUN 8 - 23 mg/dL 28  28  21    Creatinine 0.61 - 1.24 mg/dL 8.65  7.84  6.96   Sodium 135 - 145 mmol/L 129  131  133   Potassium 3.5 - 5.1 mmol/L 4.8  3.8  3.6   Chloride 98 - 111 mmol/L 93  93  99   CO2 22 - 32 mmol/L  24  26   Calcium 8.9 - 10.3 mg/dL  9.7  9.4       Latest Ref Rng & Units 03/08/2023    8:16 AM 10/25/2022    9:15 PM 06/08/2021    4:46 PM  CMP  Glucose 70 - 99 mg/dL 295  284  95   BUN 8 - 23 mg/dL 28  28  21    Creatinine 0.61 - 1.24 mg/dL 1.32  4.40  1.02   Sodium 135 - 145 mmol/L 129  131  133   Potassium 3.5 - 5.1 mmol/L 4.8  3.8  3.6   Chloride 98 - 111 mmol/L 93  93  99   CO2 22 - 32 mmol/L  24  26   Calcium 8.9 - 10.3 mg/dL  9.7  9.4     No results found for: "CHOL", "HDL", "LDLCALC", "LDLDIRECT", "TRIG", "CHOLHDL" No results for input(s): "LIPOA" in the last 8760 hours. No components found for: "NTPROBNP" No results for input(s): "PROBNP" in the last 8760 hours. No results for input(s): "TSH" in the last 8760 hours.  Physical Exam:    Today's Vitals   10/24/23  0922  BP: 100/82  Pulse: 82  Resp: 16  SpO2: 97%  Weight: 174 lb (78.9 kg)  Height: 5\' 10"  (1.778 m)   Body mass index is 24.97 kg/m. Wt Readings from Last 3 Encounters:  10/24/23 174 lb (78.9 kg)  03/30/23 176 lb (79.8 kg)  03/08/23 172 lb (78 kg)    Physical Exam  Constitutional: No distress.  Age appropriate, hemodynamically stable.   Neck: No JVD present.  Cardiovascular: Normal rate, regular rhythm, S1 normal and S2 normal. Exam reveals no gallop, no S3 and no S4.  No murmur heard. Pulmonary/Chest: Effort normal and breath sounds normal. No stridor. He has no wheezes. He has no rales.  Abdominal: Soft. Bowel sounds are normal. He exhibits no  distension. There is no abdominal tenderness.  Musculoskeletal:        General: No edema.     Cervical back: Neck supple.  Neurological: He is alert and oriented to person, place, and time. He has intact cranial nerves (2-12).  Skin: Skin is warm and moist.   Impression & Recommendation(s):  Impression:   ICD-10-CM   1. Persistent atrial fibrillation (HCC)  I48.19 EKG 12-Lead    Hemoglobin and hematocrit, blood    Basic metabolic panel    Basic metabolic panel    Hemoglobin and hematocrit, blood    2. Long term (current) use of anticoagulants  Z79.01     3. Aneurysm of the ascending aorta, without rupture (HCC)  I71.21     4. Atherosclerosis of aorta (HCC)  I70.0     5. Snoring  R06.83 Itamar Sleep Study       Recommendation(s):  Persistent atrial fibrillation (HCC) Remains in normal sinus rhythm. Described in September 2023. Rate control: Diltiazem 120 mg p.o. daily. Thromboembolic prophylaxis: Eliquis. Status post cardioversion on March 08, 2023. Patient has significantly cut down his intake of alcohol to no more than 2 beverages per day, congratulated on his efforts. Has not had a chance to get evaluated for sleep apnea but willing to have it arranged with our practice.  Will order Itamar study for now.   Long  term (current) use of anticoagulants Currently on Eliquis for thromboembolic prophylaxis. Does not endorse evidence of bleeding. Has repeat labs coming up in 2 months with PCP and will send [place] a copy for reference. Last labs available for review 03/08/2023. For safety recommend checking a H&H and BMP every 6 months Risks, benefits, and alternatives to oral anticoagulation discussed.  Patient for now is not interested in the Watchman device  Aneurysm of the ascending aorta, without rupture (HCC) Follows with cardiothoracic surgery. Reviewed the CT scan from January 2025 results noted above for further reference. Reemphasized importance of avoiding fluoroquinolone class of antibiotics if possible. Avoid heavy lifting or activities that would increase intra-abdominal pressure.  Patient verbalizes understanding. He will continue to follow with CT surgery.  Snoring Clinically there is suspicion for sleep apnea given snoring, difficulty sleeping at night, and recent diagnosis of A-fib. Did refer him to sleep medicine in the past but has not had a chance to establish care. Patient is willing to undergo Itamar study with our practice   Orders Placed:  Orders Placed This Encounter  Procedures   Hemoglobin and hematocrit, blood    Standing Status:   Future    Number of Occurrences:   1    Expected Date:   04/25/2024    Expiration Date:   10/23/2024   Basic metabolic panel    Standing Status:   Future    Number of Occurrences:   1    Expected Date:   04/25/2024    Expiration Date:   10/23/2024   EKG 12-Lead   Itamar Sleep Study    Standing Status:   Future    Expiration Date:   10/23/2024    Does the patient have access to and comfortable using a smartphone or tablet?:   Yes - WatchPat One. Device is disposable     Final Medication List:   No orders of the defined types were placed in this encounter.   There are no discontinued medications.   Current Outpatient Medications:     acetaminophen (TYLENOL) 500 MG tablet, Take 500-1,000 mg by mouth every 6 (  six) hours as needed for moderate pain., Disp: , Rfl:    calcium carbonate (TUMS - DOSED IN MG ELEMENTAL CALCIUM) 500 MG chewable tablet, Chew 1 tablet by mouth daily as needed for indigestion or heartburn., Disp: , Rfl:    diltiazem (CARDIZEM CD) 120 MG 24 hr capsule, Take 120 mg by mouth daily., Disp: , Rfl:    ELIQUIS 5 MG TABS tablet, TAKE 1 TABLET(5 MG) BY MOUTH TWICE DAILY, Disp: 60 tablet, Rfl: 6   fexofenadine (ALLEGRA) 180 MG tablet, Take 180 mg by mouth daily., Disp: , Rfl:    fluticasone (FLONASE) 50 MCG/ACT nasal spray, Place 2 sprays into both nostrils daily., Disp: , Rfl:    rosuvastatin (CRESTOR) 5 MG tablet, Take 1 tablet (5 mg total) by mouth every evening., Disp: 90 tablet, Rfl: 2   spironolactone (ALDACTONE) 25 MG tablet, Take 0.5 tablets (12.5 mg total) by mouth daily., Disp: 45 tablet, Rfl: 2   valsartan-hydrochlorothiazide (DIOVAN-HCT) 320-12.5 MG tablet, Take 1 tablet by mouth daily., Disp: , Rfl:   Consent:   NA  Disposition:   December 2025  His questions and concerns were addressed to his satisfaction. He voices understanding of the recommendations provided during this encounter.    Signed, Tessa Lerner, DO, [facility]  [facility]  [address] [place], [place] [address] 10/24/2023 10:24 AM

## 2023-10-24 NOTE — Patient Instructions (Addendum)
 Medication Instructions:  Your physician recommends that you continue on your current medications as directed. Please refer to the Current Medication list given to you today.  *If you need a refill on your cardiac medications before your next appointment, please call your pharmacy*  Lab Work: To be completed in 6 months: BMP and hemoglobin/hematocrit  If you have labs (blood work) drawn today and your tests are completely normal, you will receive your results only by: MyChart Message (if you have MyChart) OR A paper copy in the mail If you have any lab test that is abnormal or we need to change your treatment, we will call you to review the results.  Testing/Procedures: Your physician has recommended that you have a Itamar sleep study. This test records several body functions during sleep, including: brain activity, eye movement, oxygen and carbon dioxide blood levels, heart rate and rhythm, breathing rate and rhythm, the flow of air through your mouth and nose, snoring, body muscle movements, and chest and belly movement.   Follow-Up: At [facility], you and your health needs are our priority.  As part of our continuing mission to provide you with exceptional heart care, we have created designated Provider Care Teams.  These Care Teams include your primary Cardiologist (physician) and Advanced Practice Providers (APPs -  Physician Assistants and Nurse Practitioners) who all work together to provide you with the care you need, when you need it.  Your next appointment:   December 2025  The format for your next appointment:   In Person  Provider:   Tessa Lerner, DO  Please avoid fluoroquinolone antibiotic use.  {    1st Floor: - Lobby - Registration  - Pharmacy  - Lab - Cafe  2nd Floor: - PV Lab - Diagnostic Testing (echo, CT, nuclear med)  3rd Floor: - Vacant  4th Floor: - TCTS (cardiothoracic surgery) - AFib Clinic - Structural Heart Clinic - Vascular Surgery  -  Vascular Ultrasound  5th Floor: - HeartCare Cardiology (general and EP) - Clinical Pharmacy for coumadin, hypertension, lipid, weight-loss medications, and med management appointments    Valet parking services will be available as well.

## 2023-11-30 ENCOUNTER — Telehealth: Payer: Self-pay | Admitting: Cardiology

## 2023-11-30 MED ORDER — DILTIAZEM HCL ER COATED BEADS 120 MG PO CP24: 120.0000 mg | ORAL_CAPSULE | Freq: Every day | ORAL | 3 refills | Status: AC

## 2023-11-30 NOTE — Telephone Encounter (Signed)
*  STAT* If patient is at the pharmacy, call can be transferred to refill team.   1. Which medications need to be refilled? (please list name of each medication and dose if known)   diltiazem  (CARDIZEM  CD) 120 MG 24 hr capsule   Take 120 mg by mouth daily.   2. Would you like to learn more about the convenience, safety, & potential cost savings by using the [facility]? No    3. Are you open to using the [facility] No   4. Which pharmacy/location (including street and city if local pharmacy) is medication to be sent to?[id number] - [place], [place] - [address] AT [facility] [address]    5. Do they need a 30 day or 90 day supply? 90 Day Supply  Pt is currently out of medication.

## 2023-11-30 NOTE — Telephone Encounter (Signed)
 Pt's medication was sent to pt's pharmacy as requested. Confirmation received.

## 2024-01-05 ENCOUNTER — Telehealth: Payer: Self-pay

## 2024-01-05 NOTE — Telephone Encounter (Signed)
 Ordering provider: Dr. Albert Huff  Associated diagnoses:  R06.83( Snoring) I48.91 (A-Fib) I10 (HTN) WatchPAT PA obtained on 01/05/2024 by Lodema Rimes, CMA. Authorization: Per [facility] Provider Portal  Prior Authorization/Notification is not required for the requested service(s): CPT Code [address] Shawnie Delton) Decision ID #: [id number] Patient notified of PIN (1234) on 01/05/2024 via Mychart   Phone note routed to covering staff for follow-up.  Instructions for covering staff:  Please contact patient in 2 weeks if WatchPAT study results are not available yet. Remind patient to complete test.  If patient declines to proceed with test, please confirm that box is unopened and remind patient to return it to the office within 30 days. Route phone note to CV DIV SLEEP STUDIES pool for tracking.  If box has been opened, please route phone note to CV DIV SLEEP STUDIES pool to have device de-initialized and processed for billing.

## 2024-01-18 NOTE — Telephone Encounter (Signed)
Attempted to call pt, unable to reach. LMTCB.

## 2024-01-22 NOTE — Telephone Encounter (Signed)
 Spoke with pt who stated that he has been out of town and just got back yesterday. Pt stated he will be completing the sleep study this week.

## 2024-01-23 DIAGNOSIS — M25562 Pain in left knee: Secondary | ICD-10-CM | POA: Diagnosis not present

## 2024-01-24 ENCOUNTER — Other Ambulatory Visit: Payer: Medicare Other

## 2024-02-01 DIAGNOSIS — M7042 Prepatellar bursitis, left knee: Secondary | ICD-10-CM | POA: Diagnosis not present

## 2024-02-05 ENCOUNTER — Ambulatory Visit: Attending: Cardiology

## 2024-02-05 DIAGNOSIS — R0683 Snoring: Secondary | ICD-10-CM

## 2024-02-05 NOTE — Procedures (Signed)
   SLEEP STUDY REPORT Patient Information Study Date: 01/24/2024 Patient Name: [name] Patient ID: [id number] Birth Date: 1951/06/11 Age: 73 Gender: Male BMI: 24.9 (W=174 lb, H=5' 10'') Stopbang: 5 Referring Physician: Madonna Large, DO  TEST DESCRIPTION: Home sleep apnea testing was completed using the WatchPat, a Type 1 device, utilizing  peripheral arterial tonometry (PAT), chest movement, actigraphy, pulse oximetry, pulse rate, body position and snore.  AHI was calculated with apnea and hypopnea using valid sleep time as the denominator. RDI includes apneas,  hypopneas, and RERAs. The data acquired and the scoring of sleep and all associated events were performed in  accordance with the recommended standards and specifications as outlined in the AASM Manual for the Scoring of  Sleep and Associated Events 2.2.0 (2015).  FINDINGS:   1. Mild Obstructive Sleep Apnea with AHI 12.9/hr.   2. No Central Sleep Apnea with pAHIc 3.3/hr.   3. Oxygen desaturations as low as 86%.   4. Moderate to severe snoring was present. O2 sats were < 88% for 1.4 min.   5. Total sleep time was 5 hrs and 11 min.   6. 13.4% of total sleep time was spent in REM sleep.   7. Normal sleep onset latency at 23 min.   8. Prolonged REM sleep onset latency at 198 min.   9. Total awakenings were 9.  10. Arrhythmia detection: None  DIAGNOSIS: Mild Obstructive Sleep Apnea (G47.33)  RECOMMENDATIONS: 1. Clinical correlation of these findings is necessary. The decision to treat obstructive sleep apnea (OSA) is usually  based on the presence of apnea symptoms or the presence of associated medical conditions such as Hypertension,  Congestive Heart Failure, Atrial Fibrillation or Obesity. The most common symptoms of OSA are snoring, gasping for  breath while sleeping, daytime sleepiness and fatigue.  2. Initiating apnea therapy is recommended given the presence of symptoms and/or associated conditions.  Recommend  proceeding with one of the following:  a. Auto-CPAP therapy with a pressure range of 5-20cm H2O.  b. An oral appliance (OA) that can be obtained from certain dentists with expertise in sleep medicine. These are  primarily of use in non-obese patients with mild and moderate disease.  c. An ENT consultation which may be useful to look for specific causes of obstruction and possible treatment  options.  d. If patient is intolerant to PAP therapy, consider referral to ENT for evaluation for hypoglossal nerve stimulator.  3. Close follow-up is necessary to ensure success with CPAP or oral appliance therapy for maximum benefit . 4. A follow-up oximetry study on CPAP is recommended to assess the adequacy of therapy and determine the need  for supplemental oxygen or the potential need for Bi-level therapy. An arterial blood gas to determine the adequacy of  baseline ventilation and oxygenation should also be considered. 5. Healthy sleep recommendations include: adequate nightly sleep (normal 7-9 hrs/night), avoidance of caffeine after  noon and alcohol near bedtime, and maintaining a sleep environment that is cool, dark and quiet. 6. Weight loss for overweight patients is recommended. Even modest amounts of weight loss can significantly  improve the severity of sleep apnea. 7. Snoring recommendations include: weight loss where appropriate, side sleeping, and avoidance of alcohol before  bed. 8. Operation of motor vehicle should be avoided when sleepy.  Signature: Wilbert Bihari, MD; [facility]; Diplomat, American Board of Sleep  Medicine Electronically Signed: 02/05/2024 1:22:40 PM

## 2024-02-07 ENCOUNTER — Telehealth: Payer: Self-pay

## 2024-02-07 DIAGNOSIS — I10 Essential (primary) hypertension: Secondary | ICD-10-CM

## 2024-02-07 DIAGNOSIS — G4733 Obstructive sleep apnea (adult) (pediatric): Secondary | ICD-10-CM

## 2024-02-07 DIAGNOSIS — R0683 Snoring: Secondary | ICD-10-CM

## 2024-02-07 NOTE — Telephone Encounter (Signed)
-----   Message from Wilbert Bihari sent at 02/05/2024  1:23 PM EDT ----- Please let patient know that they have sleep apnea and recommend treating with CPAP.  Please order an auto CPAP from 4-15cm H2O with heated humidity and mask of choice.  Order overnight pulse ox on CPAP.  Followup with me in 6 weeks.

## 2024-02-07 NOTE — Telephone Encounter (Signed)
 Left VM with callback number for patient to receive sleep study results and recommendations.

## 2024-02-13 DIAGNOSIS — G4733 Obstructive sleep apnea (adult) (pediatric): Secondary | ICD-10-CM | POA: Diagnosis not present

## 2024-02-14 NOTE — Telephone Encounter (Signed)
 The patient has been notified of the result and verbalized understanding.  All questions (if any) were answered. [name], CMA 02/14/2024 4:32 PM    Patient is not totally sold on the cpap but he will give it a try.    Upon patient request DME selection is [facility]. Patient understands he will be contacted by [facility] to set up his cpap. Patient understands to call if [facility] does not contact him with new setup in a timely manner. Patient understands they will be called once confirmation has been received from [facility]/ that they have received their new machine to schedule 10 week follow up appointment.   [facility] notified of new cpap order  Please add to airview Patient was grateful for the call and thanked me.

## 2024-02-14 NOTE — Addendum Note (Signed)
 Addended by: JOSHUA DALTON MATSU on: 02/14/2024 04:41 PM   Modules accepted: Orders

## 2024-02-15 DIAGNOSIS — M7042 Prepatellar bursitis, left knee: Secondary | ICD-10-CM | POA: Diagnosis not present

## 2024-02-15 DIAGNOSIS — G4730 Sleep apnea, unspecified: Secondary | ICD-10-CM | POA: Diagnosis not present

## 2024-02-23 ENCOUNTER — Encounter (INDEPENDENT_AMBULATORY_CARE_PROVIDER_SITE_OTHER): Payer: Self-pay | Admitting: Cardiology

## 2024-02-23 DIAGNOSIS — G4733 Obstructive sleep apnea (adult) (pediatric): Secondary | ICD-10-CM

## 2024-02-25 ENCOUNTER — Ambulatory Visit: Payer: Self-pay | Admitting: Cardiology

## 2024-02-25 DIAGNOSIS — G4733 Obstructive sleep apnea (adult) (pediatric): Secondary | ICD-10-CM

## 2024-02-28 DIAGNOSIS — G629 Polyneuropathy, unspecified: Secondary | ICD-10-CM | POA: Diagnosis not present

## 2024-02-28 DIAGNOSIS — N1831 Chronic kidney disease, stage 3a: Secondary | ICD-10-CM | POA: Diagnosis not present

## 2024-02-28 DIAGNOSIS — Z860100 Personal history of colon polyps, unspecified: Secondary | ICD-10-CM | POA: Diagnosis not present

## 2024-02-28 DIAGNOSIS — Z0000 Encounter for general adult medical examination without abnormal findings: Secondary | ICD-10-CM | POA: Diagnosis not present

## 2024-02-28 DIAGNOSIS — Z23 Encounter for immunization: Secondary | ICD-10-CM | POA: Diagnosis not present

## 2024-02-28 DIAGNOSIS — M109 Gout, unspecified: Secondary | ICD-10-CM | POA: Diagnosis not present

## 2024-02-28 DIAGNOSIS — D6869 Other thrombophilia: Secondary | ICD-10-CM | POA: Diagnosis not present

## 2024-02-28 DIAGNOSIS — I10 Essential (primary) hypertension: Secondary | ICD-10-CM | POA: Diagnosis not present

## 2024-02-28 DIAGNOSIS — I4891 Unspecified atrial fibrillation: Secondary | ICD-10-CM | POA: Diagnosis not present

## 2024-02-28 DIAGNOSIS — E871 Hypo-osmolality and hyponatremia: Secondary | ICD-10-CM | POA: Diagnosis not present

## 2024-02-28 DIAGNOSIS — Z79899 Other long term (current) drug therapy: Secondary | ICD-10-CM | POA: Diagnosis not present

## 2024-02-28 DIAGNOSIS — E785 Hyperlipidemia, unspecified: Secondary | ICD-10-CM | POA: Diagnosis not present

## 2024-02-28 DIAGNOSIS — I7121 Aneurysm of the ascending aorta, without rupture: Secondary | ICD-10-CM | POA: Diagnosis not present

## 2024-02-28 DIAGNOSIS — J329 Chronic sinusitis, unspecified: Secondary | ICD-10-CM | POA: Diagnosis not present

## 2024-03-14 DIAGNOSIS — E871 Hypo-osmolality and hyponatremia: Secondary | ICD-10-CM | POA: Diagnosis not present

## 2024-03-20 DIAGNOSIS — H6122 Impacted cerumen, left ear: Secondary | ICD-10-CM | POA: Diagnosis not present

## 2024-03-25 ENCOUNTER — Other Ambulatory Visit: Payer: Self-pay | Admitting: Cardiology

## 2024-03-27 ENCOUNTER — Other Ambulatory Visit: Payer: Self-pay | Admitting: Cardiology

## 2024-03-27 NOTE — Telephone Encounter (Signed)
 Prescription refill request for Eliquis  received. Indication:afib Last office visit:3/25 Scr:1.30  8/24 Age: 73 Weight:78.9  kg  Prescription refilled

## 2024-05-27 DIAGNOSIS — H2511 Age-related nuclear cataract, right eye: Secondary | ICD-10-CM | POA: Diagnosis not present

## 2024-07-12 ENCOUNTER — Other Ambulatory Visit: Payer: Self-pay | Admitting: Thoracic Surgery (Cardiothoracic Vascular Surgery)

## 2024-07-12 DIAGNOSIS — I7121 Aneurysm of the ascending aorta, without rupture: Secondary | ICD-10-CM

## 2024-07-17 ENCOUNTER — Ambulatory Visit: Admitting: Podiatry

## 2024-08-14 ENCOUNTER — Ambulatory Visit (HOSPITAL_COMMUNITY)
Admission: RE | Admit: 2024-08-14 | Discharge: 2024-08-14 | Disposition: A | Discharge status: Home / Self Care | Source: Ambulatory Visit | Attending: Cardiology | Admitting: Cardiology

## 2024-08-14 DIAGNOSIS — I7121 Aneurysm of the ascending aorta, without rupture: Secondary | ICD-10-CM | POA: Diagnosis present

## 2024-08-14 MED ORDER — IOHEXOL 350 MG/ML SOLN
75.0000 mL | Freq: Once | INTRAVENOUS | Status: AC | PRN
  Administered 2024-08-14: 75 mL via INTRAVENOUS

## 2024-08-30 ENCOUNTER — Telehealth

## 2024-09-02 ENCOUNTER — Ambulatory Visit

## 2024-09-02 DIAGNOSIS — I7121 Aneurysm of the ascending aorta, without rupture: Secondary | ICD-10-CM

## 2024-09-02 NOTE — Progress Notes (Addendum)
 "     [address] [place] [address]             [phone]       CARDIOTHORACIC SURGERY TELEPHONE VIRTUAL OFFICE NOTE  Referring Provider is Shlomo Wilbert SAUNDERS, MD Primary Cardiologist is Madonna Large, DO PCP is Charlott Dorn LABOR, MD   HPI:  I spoke with [name] (DOB 06/16/1951 ) via telephone on 09/02/2024 at 2:09 PM and verified that I was speaking with the correct person using more than one form of identification.  We discussed the fact that I was contacting them from my office in [place] and they were located in [place], as well as the reason(s) for conducting our visit virtually instead of in-person.  The patient expressed understanding the circumstances and agreed to proceed as described.  [name] is a 74 year old man with medical history of hypertension, atrial fibrillation, OSA, aortic atherosclerosis, and peripheral vascular disease who is doing a virtual visit for continued surveillance of ascending thoracic aortic aneurysm.  He has been followed by our clinic since 2022.  On recent CTA of chest aneurysm measured 4.4 cm.  His blood pressure is well controlled with current medication therapy.  He occasionally checks his blood pressure at home and reports readings that are 120s/70s. He takes rosuvastatin  as prescribed. He is a never user of tobacco.   Current Outpatient Medications  Medication Sig Dispense Refill   acetaminophen  (TYLENOL ) 500 MG tablet Take 500-1,000 mg by mouth every 6 (six) hours as needed for moderate pain.     calcium  carbonate (TUMS - DOSED IN MG ELEMENTAL CALCIUM ) 500 MG chewable tablet Chew 1 tablet by mouth daily as needed for indigestion or heartburn.     diltiazem  (CARDIZEM  CD) 120 MG 24 hr capsule Take 1 capsule (120 mg total) by mouth daily. 90 capsule 3   ELIQUIS  5 MG TABS tablet TAKE 1 TABLET(5 MG) BY MOUTH TWICE DAILY 60 tablet 6   fexofenadine (ALLEGRA) 180 MG tablet Take 180 mg by mouth daily.      fluticasone (FLONASE) 50 MCG/ACT nasal spray Place 2 sprays into both nostrils daily.     rosuvastatin  (CRESTOR ) 5 MG tablet TAKE 1 TABLET(5 MG) BY MOUTH EVERY EVENING 90 tablet 2   spironolactone  (ALDACTONE ) 25 MG tablet Take 0.5 tablets (12.5 mg total) by mouth daily. 45 tablet 2   valsartan-hydrochlorothiazide  (DIOVAN-HCT) 320-12.5 MG tablet Take 1 tablet by mouth daily.     No current facility-administered medications for this visit.     Diagnostic Tests: ** ADDENDUM #1 ** ADDENDUM:   The right renal cyst measured 1 cm on the prior scan, 4.1 cm 16 HU on the current study, consistent with benign cortical cyst and no dedicated follow up imaging recommended. ----------------------------------------------------   Electronically signed by: Katheleen Faes MD 09/02/2024 03:08 PM EST RP Workstation: [id number]   ** ORIGINAL REPORT ** EXAM: CTA CHEST AORTA 08/14/2024 10:52:30 AM   TECHNIQUE: CTA of the chest was performed without and with the administration of 75 mL of intravenous iohexol  (OMNIPAQUE ) 350 MG/ML injection. Multiplanar reformatted images are provided for review. MIP images are provided for review. Automated exposure control, iterative reconstruction, and/or weight based adjustment of the mA/kV was utilized to reduce the radiation dose to as low as reasonably achievable.   COMPARISON: 08/25/2023   CLINICAL HISTORY: Aortic aneurysm suspected.   FINDINGS:   AORTA: No thoracic aortic dissection. Ascending thoracic aortic  aneurysm 4.4 cm maximum transverse diameter, stable ; the previous measurement was oblique and overestimated the diameter. Scattered calcified plaque in the arch and descending aorta.   MEDIASTINUM: Scattered coronary calcifications. Coarse calcification on an aortic valve leaflet. No mediastinal lymphadenopathy. The heart and pericardium demonstrate no acute abnormality.   LYMPH NODES: No mediastinal, hilar or axillary lymphadenopathy.    LUNGS AND PLEURA: The lungs are without acute process. No focal consolidation or pulmonary edema. No pleural effusion or pneumothorax.   UPPER ABDOMEN: 4.1 cm 16 cm cortical cyst from the mid right kidney, showing interval increase in size since 01/18/2013. Limited images of the upper abdomen are otherwise unremarkable.   SOFT TISSUES AND BONES: No acute bone or soft tissue abnormality.   IMPRESSION: 1. Ascending thoracic aortic aneurysm measuring 4.4 cm in maximum transverse diameter, stable. Continued annual surveillance imaging recommended.   Electronically signed by: Dayne Hassell MD 08/14/2024 11:05 AM EST RP Workstation: [id number]     Plan:  Aneurysm of ascending aorta without rupture -4.4 cm ascending thoracic aortic aneurysm on CTA of chest.  -We discussed the natural history and and risk factors for growth of ascending aortic aneurysms. Discussed recommendations to minimize the risk of further expansion or dissection including careful blood pressure control, avoidance of contact sports and heavy lifting, attention to lipid management.  We covered the importance of staying never user of tobacco.  The patient does not yet meet surgical criteria of >5.5cm. 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 He denies back pain, abdominal pain and blood in his urine. He is to monitor for symptoms. If he has symptoms then he should reach out to his PCP or urologist for further evaluation. No further imaging recommended at this time. -Follow up in one year with CTA of chest for continued surveillance      I discussed limitations of evaluation and management via telephone.  The patient was advised to call back for repeat telephone consultation or to seek an in-person evaluation if questions arise or the patient's clinical condition changes in any  significant manner.  I spent in excess of 20 minutes of non-face-to-face time during the conduct of this telephone virtual office consultation, including pre-visit review of the patient's records and direct conversation with the patient.   Level 1  (99441)             5-10 minutes Level 2  (99442)            11-20 minutes Level 3  (99443)            21-30 minutes   Manuelita CHRISTELLA Rough, PA-C 09/02/2024 2:09 PM  "

## 2024-09-02 NOTE — Patient Instructions (Signed)
 Risk Modification in those with ascending thoracic aortic aneurysm:   Continue control of blood pressure (prefer BP 130/80 or less)   2. Avoid fluoroquinolone antibiotics (I.e Ciprofloxacin, Avelox, Levofloxacin, Ofloxacin)   3.  Use of statin (to decrease cardiovascular risk)   4.  Exercise and activity limitations is individualized, but in general, contact sports are to be avoided and one should avoid heavy lifting (defined as half of ideal body weight) and exercises involving sustained Valsalva maneuver.   5.  Follow-up in one year with CTA chest.  OK to use a non-contrast CT if you have had a recent study for surveillance of the lung nodule.
